# Patient Record
Sex: Male | Born: 1980 | Race: White | Hispanic: No | Marital: Married | Smoking: Former smoker
Health system: Southern US, Community
[De-identification: ages and names within clinical notes are randomized; demographics above are authoritative.]

## PROBLEM LIST (undated history)

## (undated) DIAGNOSIS — T8859XA Other complications of anesthesia, initial encounter: Secondary | ICD-10-CM

## (undated) DIAGNOSIS — F988 Other specified behavioral and emotional disorders with onset usually occurring in childhood and adolescence: Secondary | ICD-10-CM

## (undated) DIAGNOSIS — F419 Anxiety disorder, unspecified: Secondary | ICD-10-CM

## (undated) DIAGNOSIS — R519 Headache, unspecified: Secondary | ICD-10-CM

## (undated) DIAGNOSIS — G4700 Insomnia, unspecified: Secondary | ICD-10-CM

## (undated) HISTORY — PX: APPENDECTOMY: SHX54

## (undated) HISTORY — DX: Insomnia, unspecified: G47.00

## (undated) HISTORY — DX: Anxiety disorder, unspecified: F41.9

## (undated) HISTORY — PX: CHOLECYSTECTOMY: SHX55

## (undated) HISTORY — PX: FOREIGN BODY REMOVAL ABDOMINAL: SHX5319

## (undated) HISTORY — DX: Other specified behavioral and emotional disorders with onset usually occurring in childhood and adolescence: F98.8

---

## 2008-04-04 ENCOUNTER — Emergency Department (HOSPITAL_COMMUNITY): Admission: EM | Admit: 2008-04-04 | Discharge: 2008-04-04 | Payer: Self-pay | Admitting: *Deleted

## 2009-02-22 ENCOUNTER — Encounter: Payer: Self-pay | Admitting: Family Medicine

## 2009-02-22 LAB — CONVERTED CEMR LAB
ALT: 22 units/L
AST: 23 units/L
Cholesterol: 220 mg/dL
Creatinine, Ser: 1 mg/dL
Direct LDL: 179 mg/dL
Glucose, Bld: 90 mg/dL

## 2010-08-05 IMAGING — CT CT HEAD W/O CM
1 series · 16 of 30 positions shown, 20 images · non-contrast
Comparison: None

CLINICAL DATA: Headache, sinus pain

CT HEAD WITHOUT CONTRAST
TECHNIQUE: Contiguous axial images were obtained from the base of
the skull through the vertex without contrast.

[Series 2: head_seq 4.5 h37s st · axial · 0.46mm/px · z∈[+1230,+1374]mm · 16 of 36 slices shown, 20 images]
[im 2/36  brain]
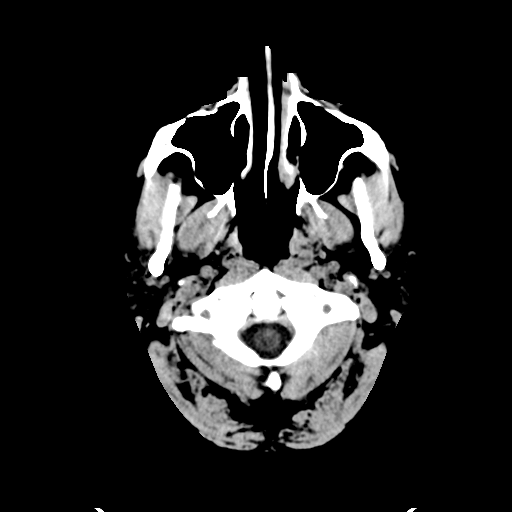
[im 2/36  bone]
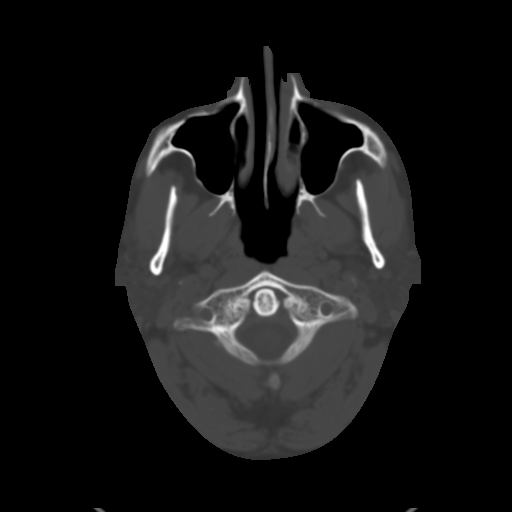
[im 4/36  brain]
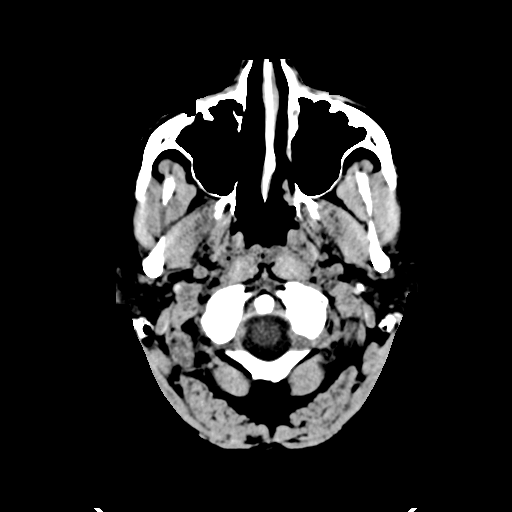
[im 7/36  brain]
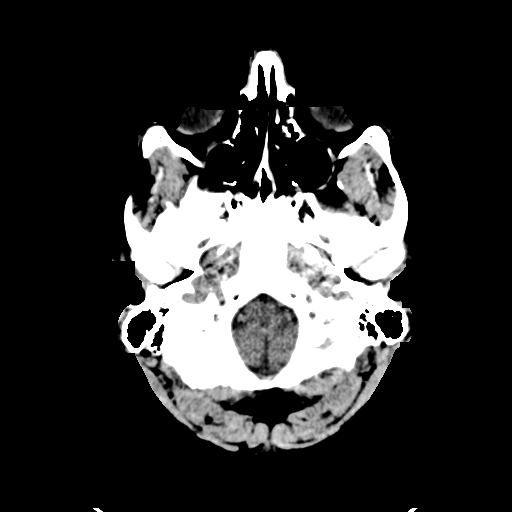
[im 9/36  brain]
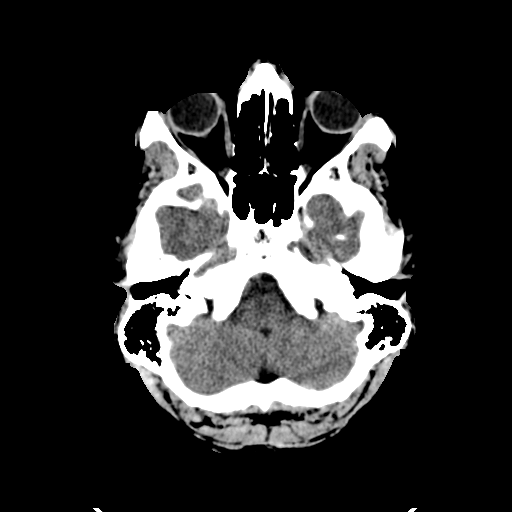
[im 10/36  brain]
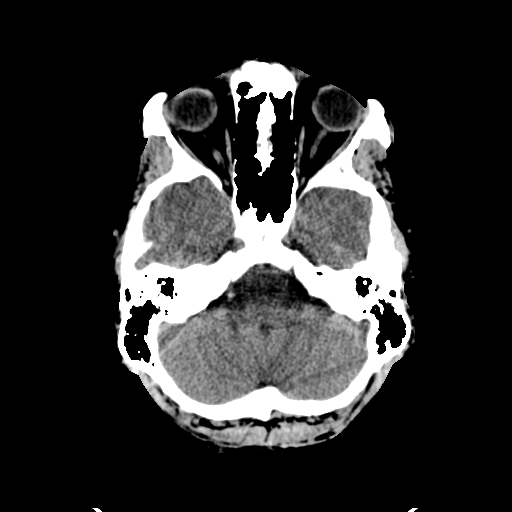
[im 10/36  bone]
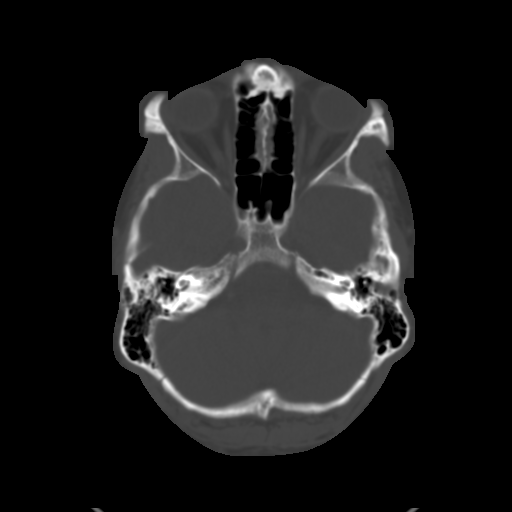
[im 13/36  brain]
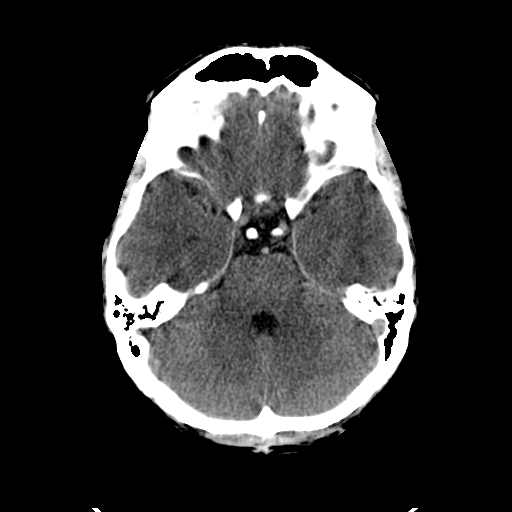
[im 15/36  brain]
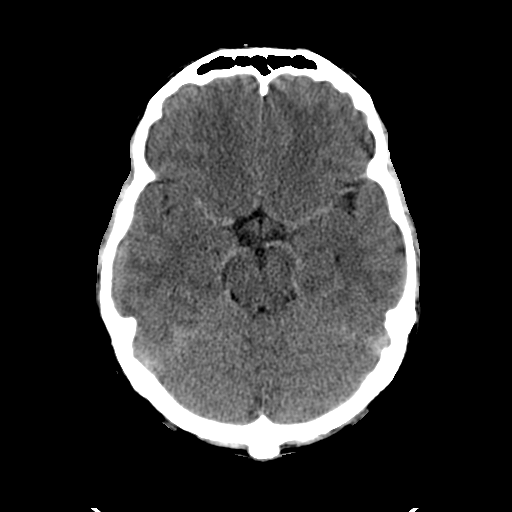
[im 17/36  brain]
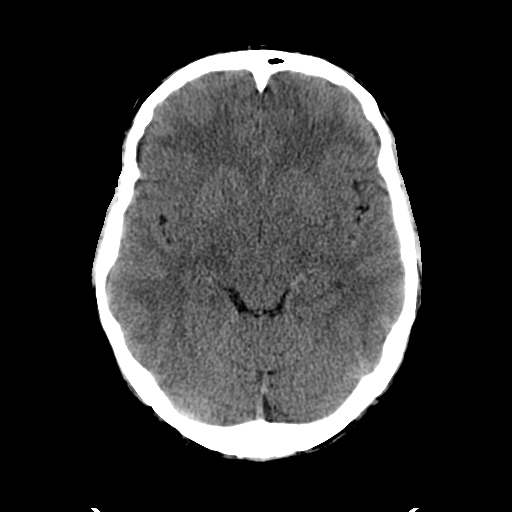
[im 19/36  brain]
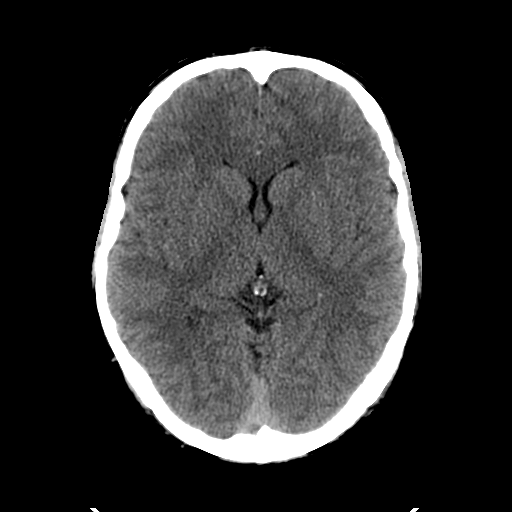
[im 19/36  bone]
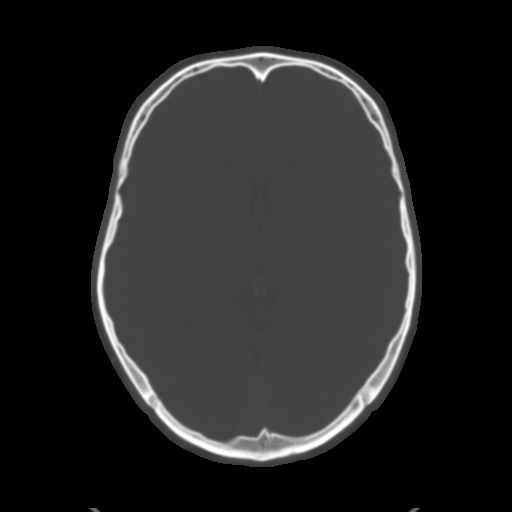
[im 21/36  brain]
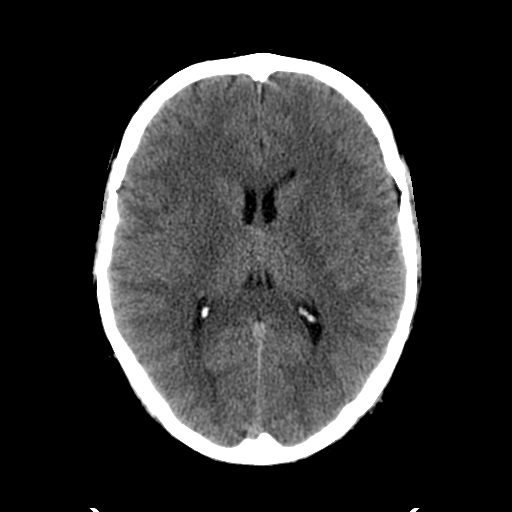
[im 23/36  brain]
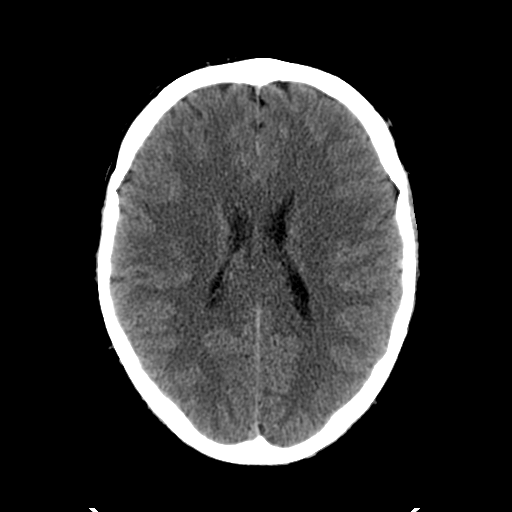
[im 26/36  brain]
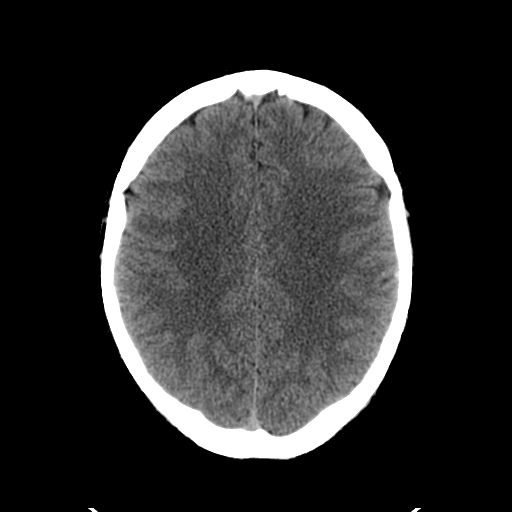
[im 27/36  brain]
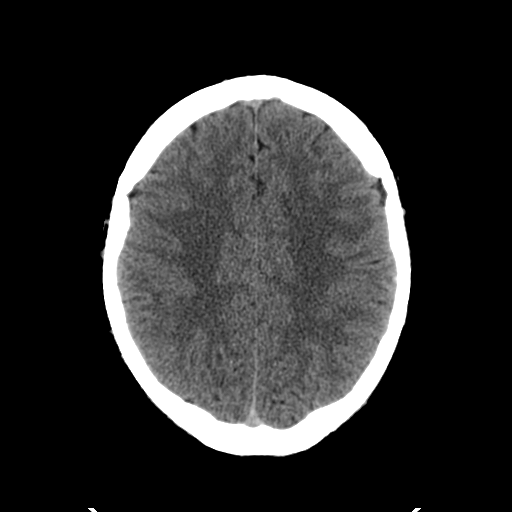
[im 27/36  bone]
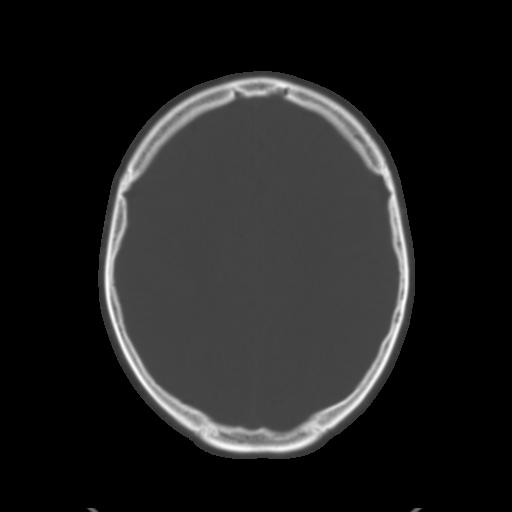
[im 29/36  brain]
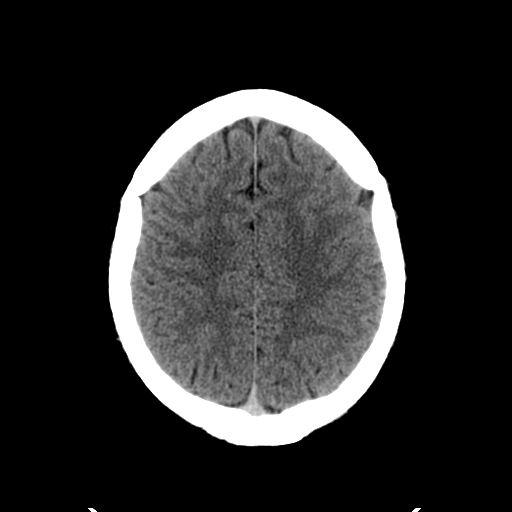
[im 32/36  brain]
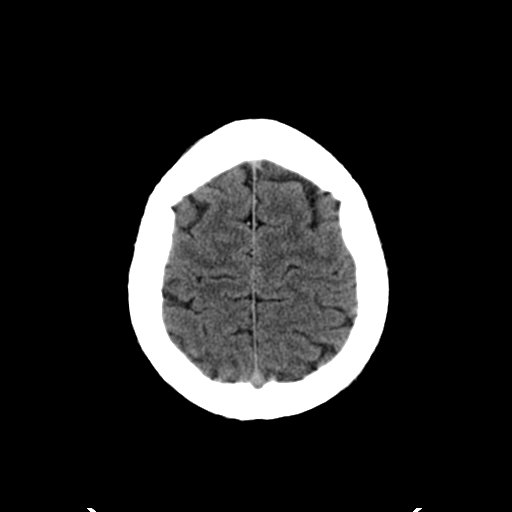
[im 34/36  brain]
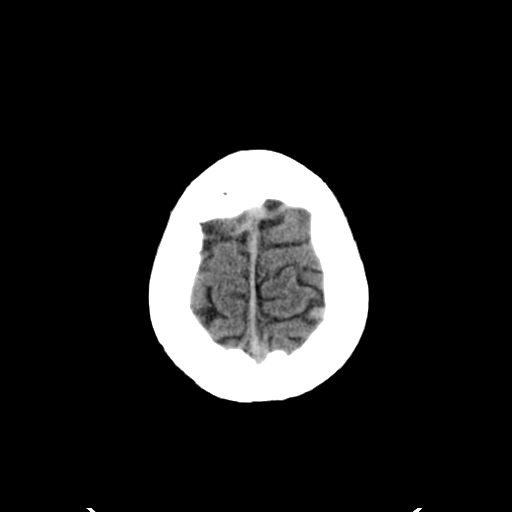

[16 of 30 positions shown; findings below may reference images not displayed]

FINDINGS: Normal ventricular morphology.
No midline shift or mass effect.
Normal appearance of brain parenchyma.
No intracranial hemorrhage, mass lesion, or acute infarct.
Visualized paranasal sinuses and mastoid air cells clear.
Bones unremarkable.
IMPRESSION: No acute intracranial abnormalities.
If patient has persistent or unexplained headache, recommend follow-
up MR imaging of the brain to further assess.

## 2010-09-01 ENCOUNTER — Ambulatory Visit (INDEPENDENT_AMBULATORY_CARE_PROVIDER_SITE_OTHER): Payer: BC Managed Care – PPO | Admitting: Family Medicine

## 2010-09-01 ENCOUNTER — Encounter: Payer: Self-pay | Admitting: Family Medicine

## 2010-09-01 DIAGNOSIS — F988 Other specified behavioral and emotional disorders with onset usually occurring in childhood and adolescence: Secondary | ICD-10-CM

## 2010-09-02 ENCOUNTER — Telehealth (INDEPENDENT_AMBULATORY_CARE_PROVIDER_SITE_OTHER): Payer: Self-pay | Admitting: *Deleted

## 2010-09-03 ENCOUNTER — Encounter: Payer: Self-pay | Admitting: Family Medicine
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
## 2010-09-09 NOTE — Progress Notes (Signed)
----   Converted from flag ---- ---- 09/01/2010 8:56 AM, Delilah Shan CMA (AAMA) wrote: Phoned [facility] and advised as below.  ---- 09/01/2010 8:56 AM, Crawford Givens MD wrote: please call over to [facility] and notify them that patient is getting concerta via our clinic but that he'll keep his next appointment there in 6 months.   thanks. ------------------------------

## 2010-09-10 ENCOUNTER — Encounter: Payer: Self-pay | Admitting: Family Medicine

## 2010-09-18 NOTE — Miscellaneous (Signed)
  Clinical Lists Changes  Observations: Added new observation of MENINGOC VAX: Menactra (02/22/2009 15:14) Added new observation of TD BOOSTER: Tdap (02/22/2009 15:14) Added new observation of MMR #1: MMR (02/22/2009 15:14) Added new observation of HEPBVAX#2: HepB Adult (02/22/2009 15:14) Added new observation of SGPT (ALT): 22 units/L (02/22/2009 15:11) Added new observation of SGOT (AST): 23 units/L (02/22/2009 15:11) Added new observation of CREATININE: 1.00 mg/dL (11/91/4782 95:62) Added new observation of BG RANDOM: 90 mg/dL (13/02/6577 46:96) Added new observation of LDL DIR: 179 mg/dL (29/52/8413 24:40) Added new observation of HDL: 40 mg/dL (05/09/2535 64:40) Added new observation of TRIGLYC TOT: 121 mg/dL (34/74/2595 63:87) Added new observation of CHOLESTEROL: 220 mg/dL (56/43/3295 18:84)      Immunization History:  Hepatitis B Immunization History:    Hepatitis B # 2:  hepb adult (02/22/2009)  MMR Immunization History:    MMR # 1:  mmr (02/22/2009)  Tetanus/Td Immunization History:    Tetanus/Td:  tdap (02/22/2009)  Meningococcal Immunization History:    Meningococcal:  menactra (02/22/2009)  Hepatitis B Vaccine # 1 (to be given today)

## 2010-09-22 ENCOUNTER — Encounter: Payer: Self-pay | Admitting: Family Medicine

## 2010-09-23 NOTE — Letter (Signed)
Summary: [facility]   Imported By: Kassie Mends 09/18/2010 11:31:08  _____________________________________________________________________  External Attachment:    Type:   Image     Comment:   External Document

## 2010-10-03 ENCOUNTER — Ambulatory Visit (INDEPENDENT_AMBULATORY_CARE_PROVIDER_SITE_OTHER): Payer: BC Managed Care – PPO | Admitting: Family Medicine

## 2010-10-03 VITALS — BP 102/78 | HR 100 | Temp 98.2°F | Ht 70.0 in | Wt 192.1 lb

## 2010-10-03 DIAGNOSIS — G4700 Insomnia, unspecified: Secondary | ICD-10-CM

## 2010-10-03 DIAGNOSIS — J029 Acute pharyngitis, unspecified: Secondary | ICD-10-CM

## 2010-10-03 DIAGNOSIS — J309 Allergic rhinitis, unspecified: Secondary | ICD-10-CM

## 2010-10-03 NOTE — Assessment & Plan Note (Addendum)
Start taking 10mg  of claritin/loratidine in the morning, use the nasal saline twice a day, and use the nasonex if not better.  Call with questions.   RST neg, fu prn.  Nontoxic.

## 2010-10-03 NOTE — Progress Notes (Signed)
Felt poorly yesterday, had some sx a few days before that.  Some post nasal, mild elevation in temp yesterday but no fevers.  Fatigued.  Minimal cough, some mucus production.  No ear pain.  Scratchy throat.  Had a strep exposure.  Normally with spring/fall allergies.  Has some nasonex at home.  Using nasal saline.    Meds, vitals, and allergies reviewed.   ROS: See HPI.  Otherwise, noncontributory.  GEN: nad, alert and oriented HEENT: mucous membranes moist, tm w/o erythema, nasal exam w/o erythema, clear discharge noted,  OP with cobblestoning NECK: supple w/o LA CV: rrr.   PULM: ctab, no inc wob EXT: no edema SKIN: no acute rash

## 2010-10-03 NOTE — Patient Instructions (Signed)
Start taking 10mg  of claritin/loratidine in the morning, use the nasal saline twice a day, and use the nasonex if not better.  Call with questions.  Glad to see you today.

## 2010-10-05 ENCOUNTER — Encounter: Payer: Self-pay | Admitting: Family Medicine

## 2010-11-18 ENCOUNTER — Ambulatory Visit (INDEPENDENT_AMBULATORY_CARE_PROVIDER_SITE_OTHER): Payer: BC Managed Care – PPO | Admitting: Family Medicine

## 2010-11-18 ENCOUNTER — Encounter: Payer: Self-pay | Admitting: Family Medicine

## 2010-11-18 VITALS — BP 102/72 | HR 84 | Temp 98.2°F | Wt 199.1 lb

## 2010-11-18 DIAGNOSIS — F988 Other specified behavioral and emotional disorders with onset usually occurring in childhood and adolescence: Secondary | ICD-10-CM

## 2010-11-18 MED ORDER — METHYLPHENIDATE HCL ER (OSM) 36 MG PO TBCR: EXTENDED_RELEASE_TABLET | ORAL | Status: DC

## 2010-11-18 NOTE — Patient Instructions (Signed)
Schedule a follow up appointment in: 6 months.  OV.   Call back sooner if needed. Call back in 3 months for another set of rxs.   Take care.  Glad to see you today.

## 2010-11-18 NOTE — Assessment & Plan Note (Addendum)
Continue current meds.  Tolerated with good effect.  3 rxs done.  He'll call back in 3 months for another 3 months supple.  Fu in 6 months, sooner prn.  It is likely that he has some vagal effect, potentially with compensatory tachycardia afterward.  It only happens with BMs, happens rarely, and he has no red flag sx.  He'll let me know if he has inc in sx or sig change.  >25 min spent with face to face with patient counseling.

## 2010-11-18 NOTE — Progress Notes (Signed)
ADD Compliant with meds:yes benefit from med (ie increase in concentration):yes change in mood:no, mood stable change in appetite:no sig change, some dec during the day but normal at night Insomnia:at baseline tremor:no compliant with behavioral modification:yes Sober.  Sig family stress.  Girlfriend's relative recently in hospital.  They are trying to work through this.   Occ palpitations while on toilet with BM. Only happens during BM.  Happens a few times a year, max.  Self resolves after a few minutes.  No syncope.  No CP.  No exertional sx.  He may go over a year w/o symptoms.    PMH and SH reviewed  ROS: See HPI.  Otherwise noncontributory.   Meds, vitals, and allergies reviewed.   GEN: nad, alert and oriented, affect wnl and appropriate HEENT: mucous membranes moist NECK: supple w/o LA CV: rrr.  PULM: ctab, no inc wob ABD: soft, +bs EXT: no edema CN 2-12 wnl, s/s/dtr wnl x4.  No tremor.

## 2011-01-02 ENCOUNTER — Ambulatory Visit (INDEPENDENT_AMBULATORY_CARE_PROVIDER_SITE_OTHER): Payer: BC Managed Care – PPO | Admitting: Family Medicine

## 2011-01-02 ENCOUNTER — Encounter: Payer: Self-pay | Admitting: Family Medicine

## 2011-01-02 VITALS — BP 120/84 | HR 80 | Temp 99.4°F | Wt 205.0 lb

## 2011-01-02 DIAGNOSIS — J329 Chronic sinusitis, unspecified: Secondary | ICD-10-CM

## 2011-01-02 MED ORDER — FLUTICASONE PROPIONATE 50 MCG/ACT NA SUSP: 2.0000 | Freq: Every day | NASAL | Status: DC

## 2011-01-02 MED ORDER — AZITHROMYCIN 250 MG PO TABS: ORAL_TABLET | ORAL | Status: AC

## 2011-01-02 NOTE — Assessment & Plan Note (Addendum)
Nasal saline and flonase in meantime, and start zmax if not improved.  Okay for outpatient f/u.  I talked with him about the ear findings.  The erythema is mild and the TM isn't bulging.  This may still be a viral process and he isn't having ear pain now. He agrees.

## 2011-01-02 NOTE — Patient Instructions (Signed)
I would use the flonase and nasal saline and then the zithromax if not improved.  Take care.  Glad to see you.

## 2011-01-02 NOTE — Progress Notes (Signed)
duration of symptoms: Sx started ~1 week ago.   Rhinorrhea: a little on Tuesday, mainly congestion since then Congestion: and nasal pressure ear pain:no sore throat:yes Cough:yes, no wheeze Myalgias: mild other concerns: voice change noted.  + fever last night.  Sweats at night. Yellow nasal discharge.   He had laid off on the nasal saline before this illness. He's working on weight loss.  In weight watchers.    ROS: See HPI.  Otherwise negative.    Meds, vitals, and allergies reviewed.   GEN: nad, alert and oriented HEENT: mucous membranes moist, L TM w/o erythema, R TM with mild erythema and effusion noted, nasal epithelium injected, OP with cobblestoning NECK: supple w/o LA CV: rrr. PULM: ctab, no inc wob ABD: soft, +bs EXT: no edema.

## 2011-05-21 ENCOUNTER — Ambulatory Visit (INDEPENDENT_AMBULATORY_CARE_PROVIDER_SITE_OTHER): Payer: BC Managed Care – PPO | Admitting: Family Medicine

## 2011-05-21 DIAGNOSIS — F988 Other specified behavioral and emotional disorders with onset usually occurring in childhood and adolescence: Secondary | ICD-10-CM

## 2011-05-21 MED ORDER — METHYLPHENIDATE HCL ER (OSM) 36 MG PO TBCR: EXTENDED_RELEASE_TABLET | ORAL | Status: DC

## 2011-05-22 ENCOUNTER — Ambulatory Visit: Payer: BC Managed Care – PPO | Admitting: Family Medicine

## 2011-05-22 NOTE — Assessment & Plan Note (Signed)
Appointment cancelled

## 2011-05-22 NOTE — Progress Notes (Signed)
Subjective:    Patient ID: Jorge Francis, male    DOB: 31-Oct-1980, 30 y.o.   MRN: [id number]  HPI    Review of Systems     Objective:   Physical Exam        Assessment & Plan:  Appointment rescheduled.

## 2011-06-16 ENCOUNTER — Ambulatory Visit: Payer: BC Managed Care – PPO | Admitting: Family Medicine

## 2013-02-10 ENCOUNTER — Other Ambulatory Visit: Payer: Self-pay | Admitting: Family Medicine

## 2013-02-10 ENCOUNTER — Other Ambulatory Visit (INDEPENDENT_AMBULATORY_CARE_PROVIDER_SITE_OTHER): Payer: BC Managed Care – PPO

## 2013-02-10 DIAGNOSIS — Z131 Encounter for screening for diabetes mellitus: Secondary | ICD-10-CM

## 2013-02-10 DIAGNOSIS — E785 Hyperlipidemia, unspecified: Secondary | ICD-10-CM

## 2013-02-10 LAB — LDL CHOLESTEROL, DIRECT: Direct LDL: 116.7 mg/dL

## 2013-02-10 LAB — LIPID PANEL
Total CHOL/HDL Ratio: 7
Triglycerides: 427 mg/dL — ABNORMAL HIGH (ref 0.0–149.0)

## 2013-02-14 ENCOUNTER — Encounter: Payer: Self-pay | Admitting: Family Medicine

## 2013-02-14 ENCOUNTER — Ambulatory Visit (INDEPENDENT_AMBULATORY_CARE_PROVIDER_SITE_OTHER): Payer: BC Managed Care – PPO | Admitting: Family Medicine

## 2013-02-14 VITALS — BP 112/78 | HR 89 | Temp 98.0°F | Ht 70.5 in | Wt 243.8 lb

## 2013-02-14 DIAGNOSIS — F988 Other specified behavioral and emotional disorders with onset usually occurring in childhood and adolescence: Secondary | ICD-10-CM

## 2013-02-14 DIAGNOSIS — Z23 Encounter for immunization: Secondary | ICD-10-CM

## 2013-02-14 DIAGNOSIS — Z0000 Encounter for general adult medical examination without abnormal findings: Secondary | ICD-10-CM

## 2013-02-14 MED ORDER — CIPROFLOXACIN HCL 500 MG PO TABS: 500.0000 mg | ORAL_TABLET | Freq: Two times a day (BID) | ORAL | Status: DC

## 2013-02-14 NOTE — Progress Notes (Signed)
CPE- See plan.  Routine anticipatory guidance given to patient.  See health maintenance. Tetanus 2010 Flu shot prev done Diet and exercise discussed.   Wife is designated if incapacitated.    Getting married in [place] in 9/14.  This has been a sig source of (good) changes.   Honeymoon planned in [place].   D/w pt about travel concerns.    ADD per psych clinic.   PMH and SH reviewed  Meds, vitals, and allergies reviewed.   ROS: See HPI.  Otherwise negative.    GEN: nad, alert and oriented HEENT: mucous membranes moist NECK: supple w/o LA CV: rrr. PULM: ctab, no inc wob ABD: soft, +bs EXT: no edema SKIN: no acute rash

## 2013-02-14 NOTE — Patient Instructions (Signed)
Take the cipro if needed.   Schedule the follow up HAV shot in 6 months.  I would get a flu shot each fall.   Take care.

## 2013-02-15 ENCOUNTER — Encounter: Payer: Self-pay | Admitting: Family Medicine

## 2013-02-15 DIAGNOSIS — Z0000 Encounter for general adult medical examination without abnormal findings: Secondary | ICD-10-CM | POA: Insufficient documentation

## 2013-02-15 NOTE — Assessment & Plan Note (Signed)
Per psych 

## 2013-02-15 NOTE — Assessment & Plan Note (Signed)
Routine anticipatory guidance given to patient.  See health maintenance. Tetanus 2010 Flu shot prev done Diet and exercise discussed.   Wife is designated if incapacitated.   Prostate/colon cancer screening not indicated.  HAV started today.  cipro given for traveller's diarrhea if needed. Travel precautions given.  I wished him will with his upcoming marriage.

## 2013-08-23 ENCOUNTER — Ambulatory Visit: Payer: BC Managed Care – PPO

## 2014-07-23 ENCOUNTER — Other Ambulatory Visit: Payer: Self-pay | Admitting: Family Medicine

## 2014-07-23 DIAGNOSIS — E781 Pure hyperglyceridemia: Secondary | ICD-10-CM

## 2014-07-24 ENCOUNTER — Other Ambulatory Visit (INDEPENDENT_AMBULATORY_CARE_PROVIDER_SITE_OTHER): Payer: 59

## 2014-07-24 DIAGNOSIS — E781 Pure hyperglyceridemia: Secondary | ICD-10-CM

## 2014-07-24 LAB — LIPID PANEL
Cholesterol: 241 mg/dL — ABNORMAL HIGH (ref 0–200)
HDL: 31.7 mg/dL — ABNORMAL LOW (ref >39.00)
NONHDL: 209.3
Total CHOL/HDL Ratio: 8
Triglycerides: 424 mg/dL — ABNORMAL HIGH (ref 0.0–149.0)
VLDL: 84.8 mg/dL — AB (ref 0.0–40.0)

## 2014-07-24 LAB — BASIC METABOLIC PANEL
BUN: 11 mg/dL (ref 6–23)
CALCIUM: 9.2 mg/dL (ref 8.4–10.5)
CO2: 23 meq/L (ref 19–32)
Chloride: 107 mEq/L (ref 96–112)
Creatinine, Ser: 0.9 mg/dL (ref 0.4–1.5)
GFR: 107.07 mL/min (ref >60.00)
GLUCOSE: 94 mg/dL (ref 70–99)
POTASSIUM: 4.3 meq/L (ref 3.5–5.1)
Sodium: 135 mEq/L (ref 135–145)

## 2014-07-24 LAB — LDL CHOLESTEROL, DIRECT: LDL DIRECT: 130.4 mg/dL

## 2014-07-27 ENCOUNTER — Encounter: Payer: Self-pay | Admitting: Family Medicine

## 2014-07-27 ENCOUNTER — Ambulatory Visit (INDEPENDENT_AMBULATORY_CARE_PROVIDER_SITE_OTHER): Payer: 59 | Admitting: Family Medicine

## 2014-07-27 VITALS — BP 112/90 | HR 94 | Temp 97.6°F | Ht 71.0 in | Wt 257.5 lb

## 2014-07-27 DIAGNOSIS — F988 Other specified behavioral and emotional disorders with onset usually occurring in childhood and adolescence: Secondary | ICD-10-CM

## 2014-07-27 DIAGNOSIS — Z0000 Encounter for general adult medical examination without abnormal findings: Secondary | ICD-10-CM

## 2014-07-27 DIAGNOSIS — Z23 Encounter for immunization: Secondary | ICD-10-CM

## 2014-07-27 DIAGNOSIS — Z7189 Other specified counseling: Secondary | ICD-10-CM

## 2014-07-27 NOTE — Patient Instructions (Addendum)
Check with your insurance to see if they will cover the nutrition consult.  Let me know if you need a referral.  Jorge Francis will call about your referral in the meantime.  [email or web address] and [email or web address] have good information on diet and exercise.  Glad to see you.  Take care.

## 2014-07-27 NOTE — Progress Notes (Signed)
Pre visit review using our clinic review tool, if applicable. No additional management support is needed unless otherwise documented below in the visit note.  CPE- See plan.  Routine anticipatory guidance given to patient.  See health maintenance. Tetanus 2010 Flu shot today.  Shingles and PNA not due.  Colon and prostate cancer screening not due.   Living will d/w pt.  Wife designated if patient incapacitated.   Diet and exercise d/w pt along with labs.   Smoking d/w pt.  Precontemplative.    He needed a referral for ADD, since his insurance changed.   PMH and SH reviewed  Meds, vitals, and allergies reviewed.   ROS: See HPI.  Otherwise negative.    GEN: nad, alert and oriented HEENT: mucous membranes moist NECK: supple w/o LA CV: rrr. PULM: ctab, no inc wob ABD: soft, +bs EXT: no edema SKIN: no acute rash

## 2014-07-29 DIAGNOSIS — Z7189 Other specified counseling: Secondary | ICD-10-CM | POA: Insufficient documentation

## 2014-07-29 NOTE — Assessment & Plan Note (Signed)
Routine anticipatory guidance given to patient.  See health maintenance. Tetanus 2010 Flu shot today.  Shingles and PNA not due.  Colon and prostate cancer screening not due.   Living will d/w pt.  Wife designated if patient incapacitated.   Diet and exercise d/w pt along with labs.   Smoking d/w pt.  Precontemplative.

## 2014-07-29 NOTE — Assessment & Plan Note (Signed)
Referral back to prev clinic done.

## 2014-07-30 ENCOUNTER — Telehealth: Payer: Self-pay | Admitting: Family Medicine

## 2014-07-30 NOTE — Telephone Encounter (Signed)
emmi emailed °

## 2014-10-03 ENCOUNTER — Ambulatory Visit (INDEPENDENT_AMBULATORY_CARE_PROVIDER_SITE_OTHER): Payer: 59 | Admitting: Family Medicine

## 2014-10-03 ENCOUNTER — Encounter: Payer: Self-pay | Admitting: Family Medicine

## 2014-10-03 VITALS — BP 124/84 | HR 92 | Temp 97.6°F | Wt 251.0 lb

## 2014-10-03 DIAGNOSIS — F909 Attention-deficit hyperactivity disorder, unspecified type: Secondary | ICD-10-CM | POA: Diagnosis not present

## 2014-10-03 DIAGNOSIS — F988 Other specified behavioral and emotional disorders with onset usually occurring in childhood and adolescence: Secondary | ICD-10-CM

## 2014-10-03 NOTE — Progress Notes (Signed)
Pre visit review using our clinic review tool, if applicable. No additional management support is needed unless otherwise documented below in the visit note.  ADD.  He was wanting to get his meds rx'd through here, ie transferring his psych care through here.  Compliant with meds.  No ADE.  He has titrated doses over the years and this current regimen does the most amount of good for the least amount of troubles. "Without my meds, I'm a hot mess."  His concentration is clearly better at work with treatment.   He has enough medicine to go to next month.  Sobriety approaching 10 years.    Meds, vitals, and allergies reviewed.   ROS: See HPI.  Otherwise, noncontributory.  nad ncat Mmm Neck supple, no LA rrr Ctab abd soft No tremor Speech affect and judgement wnl

## 2014-10-03 NOTE — Patient Instructions (Signed)
We'll work on getting your records and then we'll go from there.  We should be able to rx your meds for next month, assuming I can get your records.  If we need a PA, then we'll work on it.  Take care.  Glad to see you.

## 2014-10-04 NOTE — Assessment & Plan Note (Signed)
He is on a high dose of stimulant but w/o ADE and he has done well as is.  He has tried to titrate lower and had more troubles at work with concentration.  I am okay with this current rx regimen.  We'll get records from psych and then I'll plan on rx'ing his next set of meds.  He agrees with plan.

## 2014-10-11 ENCOUNTER — Telehealth: Payer: Self-pay

## 2014-10-11 NOTE — Telephone Encounter (Signed)
Noted, thanks.  I would have sent the rx in, but it was apparently already done.

## 2014-10-11 NOTE — Telephone Encounter (Signed)
Pt left v/m; pt's wife dx today with strep throat; pt has sorethroat,fever ?, chills and pt has appt with Dr Para Marchuncan on 10/12/14 at 3PM. Pt has missed work yesterday and today and request abx (amoxicillin).  I called pt to find out what pharmacy and he said he has spoken with another doctor who is calling in abx and cancel appt for 10/12/14. Advised done. Sent to Dr Para Marchuncan as Lorain ChildesFYI.

## 2014-10-12 ENCOUNTER — Ambulatory Visit: Payer: 59 | Admitting: Family Medicine

## 2014-10-25 ENCOUNTER — Other Ambulatory Visit: Payer: Self-pay

## 2014-10-25 MED ORDER — METHYLPHENIDATE HCL 20 MG PO TABS: 20.0000 mg | ORAL_TABLET | Freq: Every evening | ORAL | Status: DC | PRN

## 2014-10-25 MED ORDER — METHYLPHENIDATE HCL ER (OSM) 54 MG PO TBCR: EXTENDED_RELEASE_TABLET | ORAL | Status: DC

## 2014-10-25 NOTE — Telephone Encounter (Signed)
Pt left v/m requesting rx for concerta 54 mg and ritalin 20 mg. Call when ready for pick up. Pt thinks Dr Para Marchuncan should have received medical records.

## 2014-10-25 NOTE — Telephone Encounter (Signed)
Patient advised.  Rx left at front desk for pick up. 

## 2014-10-25 NOTE — Telephone Encounter (Signed)
Printed.  Thanks.  

## 2014-10-30 ENCOUNTER — Telehealth: Payer: Self-pay

## 2014-10-30 NOTE — Telephone Encounter (Signed)
Does this patient have reason to get the name-brand? Because if he doesn't, the insurance is not going to pay.

## 2014-10-30 NOTE — Telephone Encounter (Signed)
Pt left v/m; prior auth was sent for methylphenidate 54 mg. Pt wants prior auth done for name brand Concerta 54 mg taking 2 tabs by mouth in AM. Pt request cb.

## 2014-10-31 NOTE — Telephone Encounter (Signed)
I had a question about this- please check with patient to clarify.   I'm fine doing the rx and the PA- that isn't the issue.  I know that he needs a high dose of med and that would lead to the PA need.  My question is about the name brand issue- please clarify that with patient and let me know.   Did he try and fail generic med with equivalent dosing? Thanks.

## 2014-10-31 NOTE — Telephone Encounter (Signed)
The PA has been submitted and we're waiting to hear back from them. Thanks.  Please notify pt.

## 2014-10-31 NOTE — Telephone Encounter (Signed)
Pt left voicemail with triage, pt is calling to check status of PA because he hasn't received a call back

## 2014-10-31 NOTE — Telephone Encounter (Signed)
Spoke to patient and was advised that he talked with his insurance company this morning and was advised that they will not cover the generic. Patient stated that they required that he take the brand name Concerta 54 mg. Patient stated that there may also be a problem with the quantity.  Patient stated that he is due a refill Thursday.

## 2014-10-31 NOTE — Telephone Encounter (Signed)
Patient notified as instructed by telephone and verbalized understanding. Patient stated that he is going to the pharmacy tomorrow and see if the script can be filled and if not will call the office and see the status of the PA.

## 2014-11-01 NOTE — Telephone Encounter (Signed)
Noted, thanks, please notify pt/pharmacy.

## 2014-11-01 NOTE — Telephone Encounter (Signed)
Approval letter received from PA.  Placed in In Box for initials to scan.

## 2014-11-02 NOTE — Telephone Encounter (Signed)
[place] [address] notified PA for his medication has been approved.

## 2014-11-26 ENCOUNTER — Other Ambulatory Visit: Payer: Self-pay | Admitting: *Deleted

## 2014-11-26 NOTE — Telephone Encounter (Signed)
Patient left a voicemail requesting refills on Concerta and Ritalin. Last refill on both 10/25/14. Patient requested 3 months on each. Last office visit scheduled was cancelled. Call when ready for pickup.

## 2014-11-27 MED ORDER — METHYLPHENIDATE HCL ER (OSM) 54 MG PO TBCR: EXTENDED_RELEASE_TABLET | ORAL | Status: DC

## 2014-11-27 MED ORDER — METHYLPHENIDATE HCL 20 MG PO TABS: 20.0000 mg | ORAL_TABLET | Freq: Every evening | ORAL | Status: DC | PRN

## 2014-11-27 MED ORDER — METHYLPHENIDATE HCL 20 MG PO TABS
20.0000 mg | ORAL_TABLET | Freq: Every evening | ORAL | Status: DC | PRN
Start: 2014-11-27 — End: 2014-11-27

## 2014-11-27 NOTE — Telephone Encounter (Signed)
3 pairs of rxs printed.  Thanks.

## 2014-11-27 NOTE — Telephone Encounter (Signed)
Left detailed message on voicemail. Rx left at front desk for pick up.  

## 2015-01-22 ENCOUNTER — Encounter: Payer: Self-pay | Admitting: Family Medicine

## 2015-01-22 ENCOUNTER — Ambulatory Visit (INDEPENDENT_AMBULATORY_CARE_PROVIDER_SITE_OTHER): Payer: 59 | Admitting: Family Medicine

## 2015-01-22 VITALS — BP 116/80 | HR 94 | Temp 98.5°F | Wt 246.5 lb

## 2015-01-22 DIAGNOSIS — F909 Attention-deficit hyperactivity disorder, unspecified type: Secondary | ICD-10-CM

## 2015-01-22 DIAGNOSIS — G4700 Insomnia, unspecified: Secondary | ICD-10-CM

## 2015-01-22 DIAGNOSIS — F411 Generalized anxiety disorder: Secondary | ICD-10-CM

## 2015-01-22 DIAGNOSIS — L739 Follicular disorder, unspecified: Secondary | ICD-10-CM | POA: Diagnosis not present

## 2015-01-22 DIAGNOSIS — F988 Other specified behavioral and emotional disorders with onset usually occurring in childhood and adolescence: Secondary | ICD-10-CM

## 2015-01-22 MED ORDER — HYDROXYZINE HCL 10 MG PO TABS: 5.0000 mg | ORAL_TABLET | Freq: Three times a day (TID) | ORAL | Status: DC | PRN

## 2015-01-22 MED ORDER — TRAZODONE HCL 100 MG PO TABS: 200.0000 mg | ORAL_TABLET | Freq: Every day | ORAL | Status: DC

## 2015-01-22 MED ORDER — DOXYCYCLINE HYCLATE 100 MG PO TABS: 100.0000 mg | ORAL_TABLET | Freq: Two times a day (BID) | ORAL | Status: DC

## 2015-01-22 NOTE — Progress Notes (Signed)
Pre visit review using our clinic review tool, if applicable. No additional management support is needed unless otherwise documented below in the visit note.  He is 10 days apart on the concerta/ritalin rxs.  He wanted to get his rxs to "line up" with a rx of #50 of the  pills to have them coincide.  This is reasonable.  I can do it with the next set of meds.  No ADE on meds.  Concentration improved with meds as current dosed.   Occ insomnia, not every night.  Had used trazodone prn w/o ADE.  Needed refill.   Anxiety.  Longstanding, had tried buspar but couldn't tolerate.   wasn't effective, higher dose made him drowsy.  Asking about options.  He had heard of individuals using tizanidine prn.  We talked about options in general.    Itchy rash on the neck.  Started about 1 week ago.  Noted on the L side of the face.  Now anterior neck.  Not ill overall.   He has snoring but no apnea hx noted.  D/w pt about diet and exercise, he is working on both.  Not ready to stop smoking.   Meds, vitals, and allergies reviewed.   ROS: See HPI.  Otherwise, noncontributory.  GEN: nad, alert and oriented, speech and affect wnl.   HEENT: mucous membranes moist NECK: supple w/o LA.  Follicular rash noted on the anterior neck and upper chest, some similar lesions noted inferior to L sideburn.  No fluctuant mass.

## 2015-01-22 NOTE — Patient Instructions (Addendum)
Let me know when you need your next concerta/ritalin rx.  Let me know the exact run out dates.  Start taking doxy.  Try to limit shaving.  If itching, use hydrocortisone cream (OTC). Take care.  Glad to see you.

## 2015-01-23 DIAGNOSIS — G4700 Insomnia, unspecified: Secondary | ICD-10-CM | POA: Insufficient documentation

## 2015-01-23 DIAGNOSIS — L739 Follicular disorder, unspecified: Secondary | ICD-10-CM | POA: Insufficient documentation

## 2015-01-23 DIAGNOSIS — F411 Generalized anxiety disorder: Secondary | ICD-10-CM | POA: Insufficient documentation

## 2015-01-23 NOTE — Assessment & Plan Note (Signed)
Continue prn trazodone.  

## 2015-01-23 NOTE — Assessment & Plan Note (Signed)
He is 10 days apart on the concerta/ritalin rxs. He wanted to get his rxs to "line up" with a rx of #50 of the  pills to have them coincide. This is reasonable. I can do it with the next set of meds. No ADE on meds. Concentration improved with meds as current dosed.  He'll remind me about his run out dates on his meds for his next sets.  >25 minutes spent in face to face time with patient, >50% spent in counselling or coordination of care.

## 2015-01-23 NOTE — Assessment & Plan Note (Signed)
Start doxy, can't take PCN related med.  D/w pt.  Should resolve.

## 2015-01-23 NOTE — Assessment & Plan Note (Signed)
Stop buspar, can try atarax prn with sedation caution.

## 2015-02-18 ENCOUNTER — Other Ambulatory Visit: Payer: Self-pay

## 2015-02-18 MED ORDER — METHYLPHENIDATE HCL ER (OSM) 54 MG PO TBCR: 108.0000 mg | EXTENDED_RELEASE_TABLET | ORAL | Status: DC

## 2015-02-18 MED ORDER — METHYLPHENIDATE HCL 20 MG PO TABS
20.0000 mg | ORAL_TABLET | Freq: Every evening | ORAL | Status: DC | PRN
Start: 2015-02-18 — End: 2015-05-20

## 2015-02-18 MED ORDER — METHYLPHENIDATE HCL 20 MG PO TABS: 20.0000 mg | ORAL_TABLET | Freq: Every evening | ORAL | Status: DC | PRN

## 2015-02-18 MED ORDER — METHYLPHENIDATE HCL ER (OSM) 54 MG PO TBCR: EXTENDED_RELEASE_TABLET | ORAL | Status: DC

## 2015-02-18 NOTE — Telephone Encounter (Signed)
Patient already has the quantity issues worked out through the pharmacy.  He now only needs the 3 Rx's for 3 months supplies with Concerta #54 and Ritalin.

## 2015-02-18 NOTE — Telephone Encounter (Signed)
All 3 pairs printed.  Thankd.

## 2015-02-18 NOTE — Telephone Encounter (Signed)
Will you please verify this for me? We talked about it as the last OV.   He was 10 days apart on the concerta/ritalin rxs. He wanted to get his rxs to "line up" with a rx of #50 of the  pills to have them coincide. This is reasonable. I can do it with the next set of meds, assuming this is still the case.  I just don't know if his insurance will cover it.  I may have to write for the 3 pairs with an extra rx of 20 of the  pills.  Let me know.  Thanks.  I didn't print anything yet.

## 2015-02-18 NOTE — Telephone Encounter (Signed)
Pt left v/m requesting rx methylphenidate 20 mg (rx last printed # 30 x 1 on 11/27/14) and 54 mg (rx last printed # 60 x 1 on 11/27/14)the patient last seen on 01/22/15. Since pt cannot get filled until 02/26/15 will send to Dr Lianne Bushy in box for review upon return.pt also request 3 rx for each.Please advise.

## 2015-02-19 ENCOUNTER — Other Ambulatory Visit: Payer: Self-pay | Admitting: Family Medicine

## 2015-02-19 MED ORDER — METHYLPHENIDATE HCL ER (OSM) 54 MG PO TBCR: 108.0000 mg | EXTENDED_RELEASE_TABLET | ORAL | Status: DC

## 2015-02-19 NOTE — Telephone Encounter (Signed)
Patient advised.  Rx left at front desk for pick up. 

## 2015-02-19 NOTE — Progress Notes (Unsigned)
I noted that I printed one of the rx's incorrectly, with #30 instead of 60.  I voided it out along with the attached rx and reprinted both.  Thanks.

## 2015-05-20 ENCOUNTER — Other Ambulatory Visit: Payer: Self-pay

## 2015-05-20 NOTE — Telephone Encounter (Signed)
Pt left v/m requesting 3 refills each of concerta(last printed # 60 x 2 on 02/18/15) and ritalin(last printed # 30 x 2 on 02/18/15.Marland Kitchen. Pt last seen 01/22/15.

## 2015-05-21 MED ORDER — METHYLPHENIDATE HCL 20 MG PO TABS
20.0000 mg | ORAL_TABLET | Freq: Every evening | ORAL | Status: DC | PRN
Start: 2015-05-21 — End: 2015-08-18

## 2015-05-21 MED ORDER — METHYLPHENIDATE HCL ER (OSM) 54 MG PO TBCR: 108.0000 mg | EXTENDED_RELEASE_TABLET | ORAL | Status: DC

## 2015-05-21 MED ORDER — METHYLPHENIDATE HCL ER (OSM) 54 MG PO TBCR: EXTENDED_RELEASE_TABLET | ORAL | Status: DC

## 2015-05-21 MED ORDER — METHYLPHENIDATE HCL 20 MG PO TABS: 20.0000 mg | ORAL_TABLET | Freq: Every evening | ORAL | Status: DC | PRN

## 2015-05-21 NOTE — Telephone Encounter (Signed)
Printed.  Thanks.  

## 2015-05-21 NOTE — Telephone Encounter (Signed)
Patient notified that Rx is ready for pick up.

## 2015-06-11 ENCOUNTER — Ambulatory Visit (INDEPENDENT_AMBULATORY_CARE_PROVIDER_SITE_OTHER): Payer: 59 | Admitting: Family Medicine

## 2015-06-11 ENCOUNTER — Encounter: Payer: Self-pay | Admitting: Family Medicine

## 2015-06-11 VITALS — BP 112/80 | HR 99 | Temp 98.3°F | Wt 234.5 lb

## 2015-06-11 DIAGNOSIS — Z23 Encounter for immunization: Secondary | ICD-10-CM

## 2015-06-11 DIAGNOSIS — F909 Attention-deficit hyperactivity disorder, unspecified type: Secondary | ICD-10-CM

## 2015-06-11 DIAGNOSIS — R55 Syncope and collapse: Secondary | ICD-10-CM

## 2015-06-11 DIAGNOSIS — F988 Other specified behavioral and emotional disorders with onset usually occurring in childhood and adolescence: Secondary | ICD-10-CM

## 2015-06-11 NOTE — Patient Instructions (Signed)
Your EKG looks fine.   Post-micturition syncope is a form of situational vasovagal syncope that accounts for up to 5 percent of cases of syncope. The cause for this type of syncope is probably related to an abrupt change in position combined with a strong vagal stimulus.  Sit down when you urinate at night and update me if you have more episodes.  The muscle strain should get better.   Let me know when you need refills on meds.  Take care.  Glad to see you.

## 2015-06-11 NOTE — Progress Notes (Signed)
Pre visit review using our clinic review tool, if applicable. No additional management support is needed unless otherwise documented below in the visit note.  Neck sore and tender.  Along the B SCM, R>L.  Started a few days ago, see below.    He had gotten up to use the BR at night and then felt like he was going to pass out as he was urinating.  He was able to get to the floor under control, then passed out and woke up on the floor.  No injury.   Second episode was a few days later, similar, at night with urination.  3rd episode was after urinating, couldn't get to floor in time and fell to floor.  No new meds.  No h/o syncope prior to these episodes.  He could feel prodrome with the recent episodes.  No CP, SOB, BLE.  Did have some nausea with the events.    He has some incidental longstanding numbness in the L 1st toe, mentioned for documentation only, not progressive or debilitating.  Not related to shoes.  No trauma.  No other paresthesia.   Will need to change to generic short acting stimulant in 2017 due to cost, d/w pt.  We can address in early 2017 and he'll call back about that.   Meds, vitals, and allergies reviewed.   ROS: See HPI.  Otherwise, noncontributory.  GEN: nad, alert and oriented HEENT: mucous membranes moist NECK: supple w/o LA, R>L SCM ttp, no rash, no bruising.   CV: rrr.  no murmur PULM: ctab, no inc wob ABD: soft, +bs EXT: no edema SKIN: no acute rash  EKG wnl. D/w pt at OV.

## 2015-06-12 DIAGNOSIS — R55 Syncope and collapse: Secondary | ICD-10-CM | POA: Insufficient documentation

## 2015-06-12 NOTE — Assessment & Plan Note (Signed)
Will need to change to generic short acting stimulant in 2017 due to cost, d/w pt. We can address in early 2017 and he'll call back about that.  Will likely need generic methylphenidate 20mg  tabs, possibly taking 20-40mg  TID.  We can consider in 2017.

## 2015-06-12 NOTE — Assessment & Plan Note (Signed)
Sx only with post voiding, normal exam o/w today and no apparent injury other then SCM strain that should resolve.  dw pt.  He can sit when voiding at night.  If still with sx at night or other episodes, then we can refer but this appears to be a benign process.   D/w pt.  He agrees.  Still okay for outpatient f/u.  >25 minutes spent in face to face time with patient, >50% spent in counselling or coordination of care.

## 2015-08-16 ENCOUNTER — Telehealth: Payer: Self-pay | Admitting: *Deleted

## 2015-08-16 NOTE — Telephone Encounter (Signed)
Patient is requesting refills on methylphenidate 20 mg.  He is changing to [employer] - he is uninsured and is able to get this at as cheaper price.  He is asking for 20 mg take 4 daily instead of the 54 mg.  He says he has discussed this with you in the past.  Both were last filled 05/21/15 x 3 refills.  Please advise.

## 2015-08-18 MED ORDER — METHYLPHENIDATE HCL 20 MG PO TABS: ORAL_TABLET | ORAL | Status: DC

## 2015-08-18 NOTE — Telephone Encounter (Signed)
This is reasonable (and still a lower total milligram dose compared to prev).  Printed.  Thanks.

## 2015-08-19 NOTE — Telephone Encounter (Signed)
Patient advised.  Rx left at front desk for pick up. 

## 2015-09-13 ENCOUNTER — Ambulatory Visit (INDEPENDENT_AMBULATORY_CARE_PROVIDER_SITE_OTHER): Payer: Self-pay | Admitting: Urgent Care

## 2015-09-13 VITALS — BP 148/88 | HR 98 | Temp 98.4°F | Resp 16 | Ht 70.5 in | Wt 244.4 lb

## 2015-09-13 DIAGNOSIS — R52 Pain, unspecified: Secondary | ICD-10-CM

## 2015-09-13 DIAGNOSIS — R030 Elevated blood-pressure reading, without diagnosis of hypertension: Secondary | ICD-10-CM

## 2015-09-13 DIAGNOSIS — R195 Other fecal abnormalities: Secondary | ICD-10-CM

## 2015-09-13 DIAGNOSIS — K529 Noninfective gastroenteritis and colitis, unspecified: Secondary | ICD-10-CM

## 2015-09-13 DIAGNOSIS — R631 Polydipsia: Secondary | ICD-10-CM

## 2015-09-13 DIAGNOSIS — R1030 Lower abdominal pain, unspecified: Secondary | ICD-10-CM

## 2015-09-13 LAB — POCT URINALYSIS DIP (MANUAL ENTRY)
Bilirubin, UA: NEGATIVE
Glucose, UA: NEGATIVE
Ketones, POC UA: NEGATIVE
LEUKOCYTES UA: NEGATIVE
Nitrite, UA: NEGATIVE
SPEC GRAV UA: 1.025
UROBILINOGEN UA: 0.2
pH, UA: 6

## 2015-09-13 LAB — POCT GLYCOSYLATED HEMOGLOBIN (HGB A1C): HEMOGLOBIN A1C: 5

## 2015-09-13 LAB — POCT INFLUENZA A/B
Influenza A, POC: NEGATIVE
Influenza B, POC: NEGATIVE

## 2015-09-13 NOTE — Patient Instructions (Signed)

## 2015-09-13 NOTE — Progress Notes (Signed)
MRN: [id number] DOB: 28-Nov-1980  Subjective:   Jorge Francis is a 35 y.o. male presenting for chief complaint of Abdominal Pain; Nausea; Diarrhea; Hot Flashes; Generalized Body Aches; and Fatigue  Reports ~4 day history of loose stools, having 4-5 BM per day, abdominal "discomfort", nausea and vomiting x1, body aches, dizziness, irritable. Has tried Tums which helped with burping and sour brash. Has also tried Flonase for congestion. Denies bloody stools, cough, headache, chest pain, shob, dysuria, hematuria. Of note, patient eats fast food regularly. Drinks ~6 Diet [employer] daily. Smokes 1.5ppd, is not interested in quitting at all.  Jorge Francis has a current medication list which includes the following prescription(s): fluticasone, methylphenidate, trazodone, methylphenidate, and methylphenidate. Also is allergic to penicillins and buspar.  Jorge Francis  has a past medical history of Anxiety; Insomnia; and ADD (attention deficit disorder). Also  has past surgical history that includes Foreign body removal abdominal; Appendectomy; and Cholecystectomy.  His family history includes Diabetes in his maternal grandmother and paternal uncle; Heart disease in his maternal grandmother, mother, and paternal uncle. There is no history of Colon cancer or Prostate cancer.  Objective:   Vitals: BP 148/88 mmHg  Pulse 98  Temp(Src) 98.4 F (36.9 C) (Oral)  Resp 16  Ht 5' 10.5" (1.791 m)  Wt 244 lb 6.4 oz (110.859 kg)  BMI 34.56 kg/m2  SpO2 96%  Physical Exam  Constitutional: He is oriented to person, place, and time. He appears well-developed and well-nourished.  HENT:  Mouth/Throat: Oropharynx is clear and moist.  Eyes: Pupils are equal, round, and reactive to light. No scleral icterus.  Neck: Normal range of motion. Neck supple.  Cardiovascular: Normal rate, regular rhythm and intact distal pulses.  Exam reveals no gallop and no friction rub.   No murmur heard. Pulmonary/Chest: No respiratory  distress. He has no wheezes. He has no rales.  Abdominal: Soft. Bowel sounds are normal. He exhibits no distension and no mass. There is tenderness (lower abdomen).  Large vertical surgical scar.  Neurological: He is alert and oriented to person, place, and time.  Skin: Skin is warm and dry.   Results for orders placed or performed in visit on 09/13/15 (from the past 24 hour(s))  POCT Influenza A/B     Status: None   Collection Time: 09/13/15  3:33 PM  Result Value Ref Range   Influenza A, POC Negative Negative   Influenza B, POC Negative Negative  POCT urinalysis dipstick     Status: Abnormal   Collection Time: 09/13/15  3:33 PM  Result Value Ref Range   Color, UA yellow yellow   Clarity, UA clear clear   Glucose, UA negative negative   Bilirubin, UA negative negative   Ketones, POC UA negative negative   Spec Grav, UA 1.025    Blood, UA small (A) negative   pH, UA 6.0    Protein Ur, POC =30 (A) negative   Urobilinogen, UA 0.2    Nitrite, UA Negative Negative   Leukocytes, UA Negative Negative  POCT glycosylated hemoglobin (Hb A1C)     Status: None   Collection Time: 09/13/15  3:34 PM  Result Value Ref Range   Hemoglobin A1C 5.0    Assessment and Plan :   1. Gastroenteritis 2. Lower abdominal pain 3. Loose stools 4. Body aches - Likely undergoing viral gastroenteritis worsened by his food choices. Counseled on conservative management, dietary modifications. Patient verbalized understanding. Requested his notes be sent to his PCP, Dr. Para March.  5. Elevated  blood pressure reading without diagnosis of hypertension - Denies personal history of HTN. I recommended patient recheck this in 1 month and set up an office visit with his PCP if it remains >140 systolic.  [name], PA-C [facility] [phone] 09/13/2015 2:20 PM

## 2015-09-23 ENCOUNTER — Ambulatory Visit (INDEPENDENT_AMBULATORY_CARE_PROVIDER_SITE_OTHER): Payer: Self-pay | Admitting: Family Medicine

## 2015-09-23 ENCOUNTER — Encounter: Payer: Self-pay | Admitting: Family Medicine

## 2015-09-23 VITALS — BP 128/100 | HR 104 | Temp 98.1°F | Wt 245.2 lb

## 2015-09-23 DIAGNOSIS — L089 Local infection of the skin and subcutaneous tissue, unspecified: Secondary | ICD-10-CM

## 2015-09-23 DIAGNOSIS — IMO0002 Reserved for concepts with insufficient information to code with codable children: Secondary | ICD-10-CM

## 2015-09-23 DIAGNOSIS — R300 Dysuria: Secondary | ICD-10-CM

## 2015-09-23 DIAGNOSIS — J069 Acute upper respiratory infection, unspecified: Secondary | ICD-10-CM

## 2015-09-23 LAB — POC URINALSYSI DIPSTICK (AUTOMATED)
Bilirubin, UA: NEGATIVE
GLUCOSE UA: NEGATIVE
Ketones, UA: NEGATIVE
Leukocytes, UA: NEGATIVE
Nitrite, UA: NEGATIVE
UROBILINOGEN UA: 4
pH, UA: 6

## 2015-09-23 LAB — POCT RAPID STREP A (OFFICE): Rapid Strep A Screen: NEGATIVE

## 2015-09-23 NOTE — Progress Notes (Signed)
Pre visit review using our clinic review tool, if applicable. No additional management support is needed unless otherwise documented below in the visit note.  He is on short acting ADD meds, with peaks and valleys noted on/off med but able to tolerate.  This rx is cheaper for patient.   Had been ill recently, with diarrhea[facility] BP elevated at that OV.   He is cutting back on caffeine, but still with about 4 per day.  D/w pt.   He prev didn't have elevated BP in last OVs here.  No CP, BLE.   Smoker.  Have d/w pt about smoking cessation.  Precontemplative.    Prev with a small amout of blood in urine on dip at [facility]. No dysuria.   Meds, vitals, and allergies reviewed.   ROS: See HPI.  Otherwise, noncontributory.  GEN: nad, alert and oriented HEENT: mucous membranes moist, tm w/o erythema, nasal exam w/o erythema, clear discharge noted,  OP with cobblestoning and some mild exudate NECK: supple w/o LA CV: rrr.   PULM: ctab, no inc wob EXT: no edema ABD soft, not ttp, normal BS

## 2015-09-23 NOTE — Patient Instructions (Signed)
We'll contact you with your lab report. Strep test was negative.  Likely with changes in the throat related to the ongoing illness, presumed viral.  Treat it like a cold. Rest and fluids.  We'll make plans after I check your urine.  Take care.  Glad to see you.

## 2015-09-24 DIAGNOSIS — J069 Acute upper respiratory infection, unspecified: Secondary | ICD-10-CM | POA: Insufficient documentation

## 2015-09-24 NOTE — Assessment & Plan Note (Signed)
With elevated BP today.  U/a with blood on the dip but u/a micro by MD wnl, no blood seen.  I think the dip was a false positive.   I wouldn't do anything about his BP until after his likely URI sx are resolved.   If still with consistently elevated BP at that point, then he'll let me know.  Supportive care in meantime.

## 2015-11-11 ENCOUNTER — Other Ambulatory Visit: Payer: Self-pay

## 2015-11-11 MED ORDER — METHYLPHENIDATE HCL 20 MG PO TABS: ORAL_TABLET | ORAL | Status: DC

## 2015-11-11 NOTE — Telephone Encounter (Signed)
Pt left v/m requesting rx methylphenidate. (last printed # 120 x 2 on 08/18/15) last seen 09/23/15. Pt request cb when ready for pick up.

## 2015-11-11 NOTE — Telephone Encounter (Signed)
Printed.  Thanks.  

## 2015-11-12 ENCOUNTER — Other Ambulatory Visit: Payer: Self-pay | Admitting: *Deleted

## 2015-11-12 MED ORDER — TRAZODONE HCL 100 MG PO TABS: 200.0000 mg | ORAL_TABLET | Freq: Every day | ORAL | Status: DC

## 2015-11-12 NOTE — Telephone Encounter (Signed)
Patient advised.  Rx left at front desk for pick up. 

## 2015-11-12 NOTE — Telephone Encounter (Signed)
Printed.  Thanks.  

## 2015-11-12 NOTE — Telephone Encounter (Signed)
Patient requests RF to be picked up on Thursday with Methylphenidate Rx's.  Last Filled:    90 tablet 1 01/22/2015  Please advise.

## 2016-01-30 ENCOUNTER — Ambulatory Visit (INDEPENDENT_AMBULATORY_CARE_PROVIDER_SITE_OTHER): Payer: BLUE CROSS/BLUE SHIELD | Admitting: Family Medicine

## 2016-01-30 ENCOUNTER — Encounter: Payer: Self-pay | Admitting: Family Medicine

## 2016-01-30 VITALS — BP 144/104 | HR 96 | Temp 98.5°F | Wt 255.2 lb

## 2016-01-30 DIAGNOSIS — F909 Attention-deficit hyperactivity disorder, unspecified type: Secondary | ICD-10-CM

## 2016-01-30 DIAGNOSIS — K469 Unspecified abdominal hernia without obstruction or gangrene: Secondary | ICD-10-CM

## 2016-01-30 DIAGNOSIS — F988 Other specified behavioral and emotional disorders with onset usually occurring in childhood and adolescence: Secondary | ICD-10-CM

## 2016-01-30 MED ORDER — METHYLPHENIDATE HCL 20 MG PO TABS
20.0000 mg | ORAL_TABLET | Freq: Every evening | ORAL | Status: DC
Start: 2016-01-30 — End: 2016-04-22

## 2016-01-30 MED ORDER — METHYLPHENIDATE HCL 20 MG PO TABS: 20.0000 mg | ORAL_TABLET | Freq: Every evening | ORAL | Status: DC

## 2016-01-30 MED ORDER — METHYLPHENIDATE HCL ER (OSM) 54 MG PO TBCR: 108.0000 mg | EXTENDED_RELEASE_TABLET | ORAL | Status: DC

## 2016-01-30 NOTE — Patient Instructions (Signed)
Jorge Francis will call about your referral. Change back to concerta.  If BP isn't improved and GI symptoms aren't better, then let me know.  Take care.  Glad to see you.

## 2016-01-30 NOTE — Progress Notes (Signed)
Pre visit review using our clinic review tool, if applicable. No additional management support is needed unless otherwise documented below in the visit note.  Wife is 5 months pregnant, her morning sickness is better in the meantime.  She is due in November.  I offered congrats.    Loose stools/frequency.  4 BMs per day.  Not frank diarrhea. No blood in stool.  Greasy stools, but has h/o cholecystomy.  Noted to have the change with change to ritalin prev.  Prev on concerta and didn't have GI sx.  He noted more ups and downs on ritalin, with concentration changes. He felt better on concerta.    BP has been mildly elevated on home checks.    Hernia noted more often, pushing out a little more.    Meds, vitals, and allergies reviewed.   ROS: Per HPI unless specifically indicated in ROS section   GEN: nad, alert and oriented HEENT: mucous membranes moist NECK: supple w/o LA CV: rrr.  no murmur PULM: ctab, no inc wob ABD: soft, +bs, soft midline abd hernia noted, easily reduced.  EXT: no edema SKIN: no acute rash

## 2016-01-31 NOTE — Assessment & Plan Note (Signed)
Loose stools/frequency.  4 BMs per day.  Not frank diarrhea. No blood in stool.  Greasy stools, but has h/o cholecystomy.  Noted to have the change with change to ritalin prev.  Prev on concerta and didn't have GI sx.  He noted more ups and downs on ritalin, with concentration changes. He felt better on concerta. Change back to concerta and he'll update me about BP/concentration and GI sx.  If still with GI sx, can consider questran.  D/w pt.  He agrees.  We'll address BP as needed.  D/w pt about diet and exercise/weight.

## 2016-02-13 ENCOUNTER — Other Ambulatory Visit: Payer: Self-pay | Admitting: General Surgery

## 2016-02-13 DIAGNOSIS — K432 Incisional hernia without obstruction or gangrene: Secondary | ICD-10-CM

## 2016-04-22 ENCOUNTER — Other Ambulatory Visit: Payer: Self-pay

## 2016-04-22 NOTE — Telephone Encounter (Signed)
Pt left v/m requesting refill concerta last printed # 60 x 2 on 01/30/16 and ritalin last printed # 30 on 01/30/16; pt last seen 01/30/16.

## 2016-04-23 MED ORDER — METHYLPHENIDATE HCL 20 MG PO TABS: 20.0000 mg | ORAL_TABLET | Freq: Every evening | ORAL | 0 refills | Status: DC

## 2016-04-23 MED ORDER — METHYLPHENIDATE HCL ER (OSM) 54 MG PO TBCR: 108.0000 mg | EXTENDED_RELEASE_TABLET | ORAL | 0 refills | Status: DC

## 2016-04-23 NOTE — Telephone Encounter (Signed)
Printed.  Thanks.  How are they doing (pt/wife)?  She was ~5 months pregnant the last time I saw him in July.

## 2016-04-23 NOTE — Telephone Encounter (Signed)
Spoke to pt. Rxs up front ready for pickup. He said he is getting stressed out a little. The baby is due mid-November.

## 2016-05-06 ENCOUNTER — Telehealth: Payer: Self-pay

## 2016-05-06 NOTE — Telephone Encounter (Signed)
Pt's wife is going to have a baby in 3 weeks and wants to know if upto date on tdap; immunization list has tdap given on 02/22/2009. Since it has been 7 years should pt get another tdap.Please advise.

## 2016-05-07 ENCOUNTER — Ambulatory Visit (INDEPENDENT_AMBULATORY_CARE_PROVIDER_SITE_OTHER): Payer: BLUE CROSS/BLUE SHIELD

## 2016-05-07 DIAGNOSIS — Z23 Encounter for immunization: Secondary | ICD-10-CM

## 2016-05-07 NOTE — Telephone Encounter (Signed)
Patient advised.

## 2016-05-07 NOTE — Telephone Encounter (Signed)
He should be okay with prev Tdap done. Thanks.  Would need routine tetanus update in 3 more years.

## 2016-05-25 ENCOUNTER — Telehealth: Payer: Self-pay

## 2016-05-25 NOTE — Telephone Encounter (Signed)
Pt left v/m; pts wife had c section and is to be d/c from hospital on 05/27/16; once pts wife and baby are home pt will not be able to leave home easily and pt has dropped off concerta and methylphenidate rxs to [facility] on [address]. Pt request to pick up both rx prior to 05/27/16 while wife and baby are still in hospital. On rx states to fill on or after 05/30/16. Pt request cb.

## 2016-05-25 NOTE — Telephone Encounter (Addendum)
Please call the pharmacy and see if they can allow early refill on 05/27/16.  I would greatly appreciate it if they would.  Please tell patient I said congrats.   Thanks.

## 2016-05-25 NOTE — Telephone Encounter (Signed)
Gave authorization to Maureen RalphsVivian to allow refill 05-26-16 because the pt actually asked for it to be filled prior to 05-27-16.

## 2016-05-25 NOTE — Telephone Encounter (Signed)
[facility] [address] left v/m requesting cb about Concerta ad methylphenidate rx being filled early.

## 2016-05-26 DIAGNOSIS — G43009 Migraine without aura, not intractable, without status migrainosus: Secondary | ICD-10-CM | POA: Diagnosis not present

## 2016-05-26 DIAGNOSIS — F909 Attention-deficit hyperactivity disorder, unspecified type: Secondary | ICD-10-CM | POA: Insufficient documentation

## 2016-05-26 DIAGNOSIS — F17210 Nicotine dependence, cigarettes, uncomplicated: Secondary | ICD-10-CM | POA: Diagnosis not present

## 2016-05-26 DIAGNOSIS — G43909 Migraine, unspecified, not intractable, without status migrainosus: Secondary | ICD-10-CM | POA: Diagnosis present

## 2016-05-27 ENCOUNTER — Encounter (HOSPITAL_COMMUNITY): Payer: Self-pay | Admitting: Emergency Medicine

## 2016-05-27 ENCOUNTER — Emergency Department (HOSPITAL_COMMUNITY)
Admission: EM | Admit: 2016-05-27 | Discharge: 2016-05-27 | Disposition: A | Discharge status: Home / Self Care | Payer: BLUE CROSS/BLUE SHIELD | Attending: Emergency Medicine | Admitting: Emergency Medicine

## 2016-05-27 DIAGNOSIS — G43009 Migraine without aura, not intractable, without status migrainosus: Secondary | ICD-10-CM

## 2016-05-27 MED ORDER — DEXAMETHASONE SODIUM PHOSPHATE 10 MG/ML IJ SOLN
10.0000 mg | Freq: Once | INTRAMUSCULAR | Status: AC
  Administered 2016-05-27: 10 mg via INTRAVENOUS
  Filled 2016-05-27: qty 1

## 2016-05-27 MED ORDER — SODIUM CHLORIDE 0.9 % IV BOLUS (SEPSIS)
1000.0000 mL | Freq: Once | INTRAVENOUS | Status: AC
  Administered 2016-05-27: 1000 mL via INTRAVENOUS

## 2016-05-27 MED ORDER — OXYCODONE-ACETAMINOPHEN 5-325 MG PO TABS
1.0000 | ORAL_TABLET | ORAL | Status: AC | PRN
  Administered 2016-05-27 (×2): 1 via ORAL
  Filled 2016-05-27 (×2): qty 1

## 2016-05-27 MED ORDER — METOCLOPRAMIDE HCL 5 MG/ML IJ SOLN
10.0000 mg | Freq: Once | INTRAMUSCULAR | Status: AC
  Administered 2016-05-27: 10 mg via INTRAVENOUS
  Filled 2016-05-27: qty 2

## 2016-05-27 MED ORDER — DIPHENHYDRAMINE HCL 50 MG/ML IJ SOLN
25.0000 mg | Freq: Once | INTRAMUSCULAR | Status: AC
  Administered 2016-05-27: 25 mg via INTRAVENOUS
  Filled 2016-05-27: qty 1

## 2016-05-27 NOTE — Discharge Instructions (Signed)
Drink plenty of fluids. Try to get some rest. Recheck as needed.

## 2016-05-27 NOTE — ED Notes (Signed)
Pt states that he has a migraine pain 10/10 and has gotten worse throughout the day. Pt states that he has a hx of mirgrane and is able to manage pain but recently just had a baby and has been at the hospital with his spouse unable to take medications for management

## 2016-05-27 NOTE — ED Triage Notes (Signed)
Pt is c/o migraine that started around 6pm  Pt states he has taken 6 tylenol since it started and has not gotten any relief  Pt states he is sensitive to light
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
## 2016-07-24 ENCOUNTER — Ambulatory Visit (INDEPENDENT_AMBULATORY_CARE_PROVIDER_SITE_OTHER): Payer: BLUE CROSS/BLUE SHIELD | Admitting: Family Medicine

## 2016-07-24 ENCOUNTER — Encounter: Payer: Self-pay | Admitting: Family Medicine

## 2016-07-24 VITALS — BP 130/100 | HR 114 | Temp 97.8°F | Wt 258.5 lb

## 2016-07-24 DIAGNOSIS — G4700 Insomnia, unspecified: Secondary | ICD-10-CM | POA: Diagnosis not present

## 2016-07-24 DIAGNOSIS — R030 Elevated blood-pressure reading, without diagnosis of hypertension: Secondary | ICD-10-CM

## 2016-07-24 DIAGNOSIS — F988 Other specified behavioral and emotional disorders with onset usually occurring in childhood and adolescence: Secondary | ICD-10-CM

## 2016-07-24 LAB — BASIC METABOLIC PANEL
BUN: 9 mg/dL (ref 6–23)
CHLORIDE: 105 meq/L (ref 96–112)
CO2: 26 mEq/L (ref 19–32)
CREATININE: 0.79 mg/dL (ref 0.40–1.50)
Calcium: 9.2 mg/dL (ref 8.4–10.5)
GFR: 118.28 mL/min (ref >60.00)
Glucose, Bld: 98 mg/dL (ref 70–99)
POTASSIUM: 3.9 meq/L (ref 3.5–5.1)
Sodium: 137 mEq/L (ref 135–145)

## 2016-07-24 LAB — LIPID PANEL
CHOL/HDL RATIO: 6
Cholesterol: 257 mg/dL — ABNORMAL HIGH (ref 0–200)
HDL: 40.3 mg/dL (ref >39.00)
NONHDL: 216.92
Triglycerides: 347 mg/dL — ABNORMAL HIGH (ref 0.0–149.0)
VLDL: 69.4 mg/dL — AB (ref 0.0–40.0)

## 2016-07-24 LAB — LDL CHOLESTEROL, DIRECT: Direct LDL: 152 mg/dL

## 2016-07-24 MED ORDER — METHYLPHENIDATE HCL ER (OSM) 54 MG PO TBCR: 108.0000 mg | EXTENDED_RELEASE_TABLET | ORAL | 0 refills | Status: DC

## 2016-07-24 MED ORDER — METHYLPHENIDATE HCL 20 MG PO TABS: 20.0000 mg | ORAL_TABLET | Freq: Every evening | ORAL | 0 refills | Status: DC

## 2016-07-24 NOTE — Progress Notes (Signed)
Daughter is about 302 months old and doing well.  They are starting to get some sleep.    Smoking d/w pt.  Trying to limit use, not near his child, not indoors.  D/w pt.   ADD.  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  No ADE on meds.  Able to concentrate better with med.  He can tell when he is off med, when missed a dose.  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  No tremor.  Appetite changes noted but he has adjusted.  He takes med after breakfast, that seems to work better for patient.    He has been putting off his hernia repair due to work and child care needs.  He has seen surgery prev.  D/w pt.    Some insomnia, now with rare use of trazodone.  No ADE on med.  Not consistently used.    Elevated BP noted, fasting, due for routine labs.    Meds, vitals, and allergies reviewed.   ROS: Per HPI unless specifically indicated in ROS section   GEN: nad, alert and oriented HEENT: mucous membranes moist NECK: supple w/o LA CV: rrr.  PULM: ctab, no inc wob ABD: soft, +bs, hernia noted but soft.   EXT: no edema SKIN: no acute rash CN 2-12 wnl B, S/S wnl x4 grossly.

## 2016-07-24 NOTE — Patient Instructions (Signed)
Go to the lab on the way out.  We'll contact you with your lab report. Don't change your meds for now.  Take care.  Glad to see you.  

## 2016-07-27 ENCOUNTER — Other Ambulatory Visit: Payer: Self-pay | Admitting: Family Medicine

## 2016-07-27 DIAGNOSIS — R030 Elevated blood-pressure reading, without diagnosis of hypertension: Secondary | ICD-10-CM | POA: Insufficient documentation

## 2016-07-27 DIAGNOSIS — E781 Pure hyperglyceridemia: Secondary | ICD-10-CM

## 2016-07-27 NOTE — Assessment & Plan Note (Signed)
Controlled, improved with med.  Continue as is.  D/w pt.  He agrees.  No illicits.  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  rx given to patient. Smoking cessation d/w pt.

## 2016-07-27 NOTE — Assessment & Plan Note (Signed)
D/w pt about diet and exercise and weight.  See notes on labs.

## 2016-07-27 NOTE — Assessment & Plan Note (Signed)
Continue trazodone.  Able to tolerate as is.  No ADE on med.

## 2016-08-20 ENCOUNTER — Telehealth: Payer: Self-pay | Admitting: Family Medicine

## 2016-08-20 MED ORDER — OSELTAMIVIR PHOSPHATE 75 MG PO CAPS: 75.0000 mg | ORAL_CAPSULE | Freq: Every day | ORAL | 0 refills | Status: DC

## 2016-08-20 NOTE — Telephone Encounter (Signed)
Spoke with patient, his wife was diagnosed this am with Flu.  Pt has no symptoms at this time, but has a newborn at home and is requesting Tamiflu.  Pharmacy of choice is the [facility] on [address]/ [address] in [place].  Best number to call with any questions is [phone]

## 2016-08-20 NOTE — Telephone Encounter (Signed)
Start qd dosing, if flu sx then change to BID dosing.  Sent.  Thanks.   Hope they all feel better soon.

## 2016-08-20 NOTE — Telephone Encounter (Signed)
Pt notified Rx sent and advise pt of Dr. Lianne Bushyuncan's comments regarding dosing

## 2016-09-14 ENCOUNTER — Other Ambulatory Visit: Payer: Self-pay | Admitting: Family Medicine

## 2016-09-14 NOTE — Telephone Encounter (Signed)
Left message on voicemail for patient to call back. 

## 2016-10-19 ENCOUNTER — Other Ambulatory Visit: Payer: Self-pay

## 2016-10-19 NOTE — Telephone Encounter (Signed)
Pt left v/m requesting rx concerta (last printed # 60  X 2 on 07/24/16) and ritalin (last printed # 30 x 2  on 07/24/16). Last seen 07/24/16. Call pt when ready for pick up.

## 2016-10-20 ENCOUNTER — Encounter: Payer: Self-pay | Admitting: Family Medicine

## 2016-10-20 MED ORDER — METHYLPHENIDATE HCL ER (OSM) 54 MG PO TBCR: 108.0000 mg | EXTENDED_RELEASE_TABLET | ORAL | 0 refills | Status: DC

## 2016-10-20 MED ORDER — METHYLPHENIDATE HCL 20 MG PO TABS
20.0000 mg | ORAL_TABLET | Freq: Every evening | ORAL | 0 refills | Status: DC
Start: 2016-10-20 — End: 2017-03-08

## 2016-10-20 MED ORDER — METHYLPHENIDATE HCL ER (OSM) 54 MG PO TBCR
108.0000 mg | EXTENDED_RELEASE_TABLET | ORAL | 0 refills | Status: DC
Start: 2016-10-20 — End: 2017-03-08

## 2016-10-20 MED ORDER — METHYLPHENIDATE HCL 20 MG PO TABS: 20.0000 mg | ORAL_TABLET | Freq: Every evening | ORAL | 0 refills | Status: DC

## 2016-10-20 NOTE — Telephone Encounter (Signed)
Spoke to pt and informed him Rx is available for pickup from the front desk. Pt advised third party unable to pickup .  

## 2016-10-20 NOTE — Telephone Encounter (Signed)
Printed.  Thanks.  

## 2016-10-21 ENCOUNTER — Encounter: Payer: Self-pay | Admitting: Family Medicine

## 2017-03-08 ENCOUNTER — Telehealth: Payer: Self-pay | Admitting: Family Medicine

## 2017-03-08 ENCOUNTER — Ambulatory Visit (INDEPENDENT_AMBULATORY_CARE_PROVIDER_SITE_OTHER): Payer: Self-pay | Admitting: Family Medicine

## 2017-03-08 ENCOUNTER — Encounter: Payer: Self-pay | Admitting: Family Medicine

## 2017-03-08 VITALS — BP 122/96 | HR 100 | Temp 97.6°F | Wt 258.8 lb

## 2017-03-08 DIAGNOSIS — F988 Other specified behavioral and emotional disorders with onset usually occurring in childhood and adolescence: Secondary | ICD-10-CM

## 2017-03-08 DIAGNOSIS — F1020 Alcohol dependence, uncomplicated: Secondary | ICD-10-CM | POA: Insufficient documentation

## 2017-03-08 MED ORDER — BUPROPION HCL 75 MG PO TABS: 75.0000 mg | ORAL_TABLET | Freq: Two times a day (BID) | ORAL | 0 refills | Status: DC

## 2017-03-08 NOTE — Patient Instructions (Signed)
Let me consider some options in the meantime.  We'll be in touch. Take care.  Glad to see you.

## 2017-03-08 NOTE — Assessment & Plan Note (Signed)
Needs treatment, wife is supportive. No suicidal or homicidal intent. At this point appears okay for outpatient follow-up. Encourage some form of organized treatment, meetings, outpatient treatment, inpatient treatment, etc. He is pre-contemplative. Discussed with him about not stopping alcohol cold [place] and trying to limit his intake in the meantime. >25 minutes spent in face to face time with patient, >50% spent in counselling or coordination of care.

## 2017-03-08 NOTE — Telephone Encounter (Signed)
Notify patient, considered options. The main issue here is still getting treatment for alcohol use. Everything else likely secondary to that. Let me know if/when I can help him get some treatment for alcohol use. In the meantime likely reasonable to try Wellbutrin to see if it helps his mood and potentially helps his concentration. Prescription sent. Start with 1 pill a day, increase to 1 pill twice a day after about 5 days. Please let me know how he is doing in a few days, sooner if needed. Thanks.

## 2017-03-08 NOTE — Progress Notes (Signed)
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  "It's the best thing I've ever done."   However- now with Etoh use.  "I've been drinking."  Up to 16 drinks at night.  Started back about 6 months ago.  "I'm functional."   Wife is supportive.  Taking trazodone rarely, as needed.  He didn't think he could taper down well/easily.  No illicits.  He isn't set on quitting yet.  No SI/HI.  We discussed options about treatment.  He may consider meetings in the meantime and he may want to taper slowly.  No tremor noted by patient.    He was asking about nonstimulant ADD meds.  He was prev on concerta.  He has noted some depression and anxiety recently.  D/w pt.  He contracts for safety.  No SZ hx.    His brother recently overdosed.  D/w pt.  Elevated BP out of clinic, d/w pt.   Smoking more since being off ADD meds.  D/w pt.  Work stressors noted, d/w pt.  He is trying to work through the situation at his job    ROS: Per HPI unless specifically indicated in ROS section   Meds, vitals, and allergies reviewed.   GEN: nad, alert and oriented, affect wnl and appropriate, speech wnl HEENT: mucous membranes moist NECK: supple w/o LA CV: rrr.  PULM: ctab, no inc wob ABD: soft, +bs EXT: no edema No tremor.

## 2017-03-08 NOTE — Assessment & Plan Note (Signed)
Off stimulant medication. Attention is worse. His mood is worse recently. No suicidal or homicidal intent. There are some potential issues with non-stimulant medications for ADD and worsening mood. Discussed with patient. Wellbutrin may be the best option for his mood, acknowledging that it is off label for ADD. See following phone note. Discussed with patient at office visit about potential considerations.

## 2017-03-09 NOTE — Telephone Encounter (Signed)
Patient advised.

## 2017-12-29 ENCOUNTER — Ambulatory Visit: Payer: Self-pay | Admitting: Family Medicine

## 2018-01-10 ENCOUNTER — Other Ambulatory Visit: Payer: Self-pay | Admitting: Family Medicine

## 2018-01-10 DIAGNOSIS — E781 Pure hyperglyceridemia: Secondary | ICD-10-CM

## 2018-01-11 ENCOUNTER — Other Ambulatory Visit (INDEPENDENT_AMBULATORY_CARE_PROVIDER_SITE_OTHER): Payer: No Typology Code available for payment source

## 2018-01-11 DIAGNOSIS — E781 Pure hyperglyceridemia: Secondary | ICD-10-CM

## 2018-01-11 LAB — LIPID PANEL
Cholesterol: 254 mg/dL — ABNORMAL HIGH (ref 0–200)
HDL: 29.2 mg/dL — AB (ref >39.00)
NONHDL: 224.33
Total CHOL/HDL Ratio: 9
Triglycerides: 309 mg/dL — ABNORMAL HIGH (ref 0.0–149.0)
VLDL: 61.8 mg/dL — AB (ref 0.0–40.0)

## 2018-01-11 LAB — CBC WITH DIFFERENTIAL/PLATELET
BASOS PCT: 0.5 % (ref 0.0–3.0)
Basophils Absolute: 0 10*3/uL (ref 0.0–0.1)
EOS ABS: 0.2 10*3/uL (ref 0.0–0.7)
EOS PCT: 2.6 % (ref 0.0–5.0)
HCT: 47.2 % (ref 39.0–52.0)
Hemoglobin: 16.3 g/dL (ref 13.0–17.0)
LYMPHS ABS: 2.6 10*3/uL (ref 0.7–4.0)
Lymphocytes Relative: 30 % (ref 12.0–46.0)
MCHC: 34.5 g/dL (ref 30.0–36.0)
MCV: 91.3 fl (ref 78.0–100.0)
MONO ABS: 0.9 10*3/uL (ref 0.1–1.0)
Monocytes Relative: 10.4 % (ref 3.0–12.0)
NEUTROS PCT: 56.5 % (ref 43.0–77.0)
Neutro Abs: 4.9 10*3/uL (ref 1.4–7.7)
PLATELETS: 242 10*3/uL (ref 150.0–400.0)
RBC: 5.17 Mil/uL (ref 4.22–5.81)
RDW: 13.1 % (ref 11.5–15.5)
WBC: 8.6 10*3/uL (ref 4.0–10.5)

## 2018-01-11 LAB — COMPREHENSIVE METABOLIC PANEL
ALT: 21 U/L (ref 0–53)
AST: 14 U/L (ref 0–37)
Albumin: 4.4 g/dL (ref 3.5–5.2)
Alkaline Phosphatase: 109 U/L (ref 39–117)
BUN: 13 mg/dL (ref 6–23)
CHLORIDE: 105 meq/L (ref 96–112)
CO2: 24 mEq/L (ref 19–32)
CREATININE: 0.71 mg/dL (ref 0.40–1.50)
Calcium: 9.4 mg/dL (ref 8.4–10.5)
GFR: 132.69 mL/min (ref >60.00)
GLUCOSE: 105 mg/dL — AB (ref 70–99)
POTASSIUM: 4.1 meq/L (ref 3.5–5.1)
SODIUM: 137 meq/L (ref 135–145)
Total Bilirubin: 0.3 mg/dL (ref 0.2–1.2)
Total Protein: 7.3 g/dL (ref 6.0–8.3)

## 2018-01-11 LAB — LDL CHOLESTEROL, DIRECT: LDL DIRECT: 176 mg/dL

## 2018-01-21 ENCOUNTER — Ambulatory Visit (INDEPENDENT_AMBULATORY_CARE_PROVIDER_SITE_OTHER): Payer: No Typology Code available for payment source | Admitting: Family Medicine

## 2018-01-21 ENCOUNTER — Encounter: Payer: Self-pay | Admitting: Family Medicine

## 2018-01-21 VITALS — BP 110/78 | HR 98 | Temp 98.4°F | Ht 70.0 in | Wt 236.5 lb

## 2018-01-21 DIAGNOSIS — F1020 Alcohol dependence, uncomplicated: Secondary | ICD-10-CM

## 2018-01-21 DIAGNOSIS — E781 Pure hyperglyceridemia: Secondary | ICD-10-CM

## 2018-01-21 DIAGNOSIS — Z0000 Encounter for general adult medical examination without abnormal findings: Secondary | ICD-10-CM

## 2018-01-21 DIAGNOSIS — M7662 Achilles tendinitis, left leg: Secondary | ICD-10-CM

## 2018-01-21 DIAGNOSIS — Z7189 Other specified counseling: Secondary | ICD-10-CM

## 2018-01-21 DIAGNOSIS — M7661 Achilles tendinitis, right leg: Secondary | ICD-10-CM

## 2018-01-21 DIAGNOSIS — J309 Allergic rhinitis, unspecified: Secondary | ICD-10-CM

## 2018-01-21 DIAGNOSIS — M7660 Achilles tendinitis, unspecified leg: Secondary | ICD-10-CM

## 2018-01-21 NOTE — Patient Instructions (Addendum)
Try walking for exercise but use heel lifts and gently stretch your calves.   Ice your heels for 5 minutes.   Update me as needed.   I would get a flu shot each fall.

## 2018-01-21 NOTE — Progress Notes (Signed)
CPE- See plan.  Routine anticipatory guidance given to patient.  See health maintenance.  The possibility exists that previously documented standard health maintenance information may have been brought forward from a previous encounter into this note.  If needed, that same information has been updated to reflect the current situation based on today's encounter.   Tetanus 2010 Flu shot encouraged.   Shingles and PNA not due.  Colon and prostate cancer screening not due.   Living will d/w pt.  Wife designated if patient incapacitated.   Diet and exercise d/w pt along with labs.   Smoking d/w pt.  Precontemplative.  He is considering options to quit, discussed.   [facility]  Attention d/w pt.  He is putting off med tx until he has 1 year sobriety.  He is working to manage off meds but still has sx.  D/w pt.    HA improved with flonase- sig environmental allergy hx  He has been sober for 9 months.  D/w pt.  His insomnia is better.  I encouraged him to maintain and continue his sobriety.  He is going to meetings.  He has a sponsor.  His nephew has been visiting for 2 weeks.  Unclear if the nephew will be able to stay with patient for the school year.  D/w pt.    B heel pain w/o trauma.  Posterior heel pain.  Going on for months.  Discomfort with walking.  He does not have plantar pain.  PMH and SH reviewed  Meds, vitals, and allergies reviewed.   ROS: Per HPI.  Unless specifically indicated otherwise in HPI, the patient denies:  General: fever. Eyes: acute vision changes ENT: sore throat Cardiovascular: chest pain Respiratory: SOB GI: vomiting GU: dysuria Musculoskeletal: acute back pain Derm: acute rash Neuro: acute motor dysfunction Psych: worsening mood Endocrine: polydipsia Heme: bleeding Allergy: hayfever  GEN: nad, alert and oriented HEENT: mucous membranes moist NECK: supple w/o LA CV: rrr. PULM: ctab, no inc wob ABD: soft, +bs EXT: no  edema SKIN: no acute rash Plantar fascia not tender bilaterally but he has tenderness at the insertion of the Achilles tendon at the calcaneus.  No local redness.  Able to bear weight.

## 2018-01-23 DIAGNOSIS — M7660 Achilles tendinitis, unspecified leg: Secondary | ICD-10-CM | POA: Insufficient documentation

## 2018-01-23 NOTE — Assessment & Plan Note (Signed)
Similar symptoms and issues in both feet.  Discussed with him about stretching the Achilles tendon and his calf muscles along with using appropriate foot wear and a heel lift.  Update me as needed.  He agrees.  See after visit summary.  Icing will help.

## 2018-01-23 NOTE — Assessment & Plan Note (Signed)
Tetanus 2010 Flu shot encouraged.   Shingles and PNA not due.  Colon and prostate cancer screening not due.   Living will d/w pt.  Wife designated if patient incapacitated.   Diet and exercise d/w pt along with labs.   Smoking d/w pt.  Precontemplative.  He is considering options to quit, discussed.   [facility]

## 2018-01-23 NOTE — Assessment & Plan Note (Signed)
He has 9 months of sobriety currently.  Encouraged.  He is still going to meetings and has a sponsor.  He will update me as needed.

## 2018-01-23 NOTE — Assessment & Plan Note (Signed)
He is putting off treatment until he has at least 1 year of sobriety.  This is reasonable.  He is trying to compensate as much as he can in the meantime.

## 2018-01-23 NOTE — Assessment & Plan Note (Signed)
Wife designated if patient were incapacitated.  

## 2018-01-23 NOTE — Assessment & Plan Note (Signed)
His lipids are elevated but I am more worried about him maintaining sobriety and working on smoking.  Discussed.  He agrees.  On diet and exercise as best he can.  He will try to address his Achilles tendinitis and then restart walking for exercise.  He agrees with plan.

## 2018-01-23 NOTE — Assessment & Plan Note (Signed)
Continue Flonase.  Update me as needed. 

## 2018-05-23 ENCOUNTER — Ambulatory Visit: Payer: No Typology Code available for payment source | Admitting: Family Medicine

## 2018-05-23 ENCOUNTER — Encounter: Payer: Self-pay | Admitting: Family Medicine

## 2018-05-23 DIAGNOSIS — H109 Unspecified conjunctivitis: Secondary | ICD-10-CM

## 2018-05-23 MED ORDER — POLYMYXIN B-TRIMETHOPRIM 10000-0.1 UNIT/ML-% OP SOLN: 1.0000 [drp] | OPHTHALMIC | 0 refills | Status: DC

## 2018-05-23 NOTE — Patient Instructions (Signed)
Presumed pinkeye.   Usually transmitted by direct contact.  Handwashing dramatically decreases transmission.  Usually not contagious when discharge is gone but some irritation can persist.  Likely much less contagious after 2 days of drops.  Should be able to go to work tomorrow.  Take care.  Glad to see you.

## 2018-05-23 NOTE — Progress Notes (Signed)
Left eye

## 2018-05-23 NOTE — Progress Notes (Signed)
Daughter had pinkeye.  That is resolved but then she had a fever this AM.  She is being treated for ear infection.   Now with patient with L eye sx, started about 2 days ago.  Then had drainage, discharge.  Crusted in the AM.  Started polymyxin/trimethoprim eyedrops in the meantime with some improvement.  No R eye sx.    No vision changes unless occluded by discharge.  Drainage is getting better.  No FCNAVD.    His brother's kids are living with him.  And his wife is pregnant with their second child (they found out 10 days ago).  Mood d/w pt.  He is in marriage counseling.  He doesn't qualify for food stamps, "and we're going to make it."  I asked him to update me as needed.  He agreed.  Meds, vitals, and allergies reviewed.   ROS: Per HPI unless specifically indicated in ROS section   No apparent distress. Speech normal. Mood and affect appear normal. Normocephalic atraumatic Tympanic membranes within normal limits bilaterally. Pupils equally round and reactive to light. Right conjunctiva within normal limits. Bilateral upper and lower lids within normal limits. Left conjunctiva irritated, medial greater than lateral, no discharge at office visit. Nasal and oropharyngeal exam unremarkable. Neck supple, no lymphadenopathy.

## 2018-05-24 DIAGNOSIS — H109 Unspecified conjunctivitis: Secondary | ICD-10-CM | POA: Insufficient documentation

## 2018-05-24 NOTE — Assessment & Plan Note (Signed)
Reasonable to continue polymyxin/trimethoprim eyedrops.  He appears to be improving.  Routine cautions discussed with patient.  He should be able to go back to work soon.  See after visit summary.  Update me as needed.  About his situation at home overall.  Support offered.  He will update me as needed.  He agrees.

## 2018-08-05 ENCOUNTER — Ambulatory Visit: Payer: No Typology Code available for payment source | Admitting: Family Medicine

## 2018-08-05 ENCOUNTER — Encounter: Payer: Self-pay | Admitting: Family Medicine

## 2018-08-05 VITALS — BP 112/82 | HR 79 | Temp 98.7°F | Ht 70.0 in | Wt 249.5 lb

## 2018-08-05 DIAGNOSIS — R059 Cough, unspecified: Secondary | ICD-10-CM

## 2018-08-05 DIAGNOSIS — Z659 Problem related to unspecified psychosocial circumstances: Secondary | ICD-10-CM

## 2018-08-05 DIAGNOSIS — R05 Cough: Secondary | ICD-10-CM | POA: Diagnosis not present

## 2018-08-05 LAB — POC INFLUENZA A&B (BINAX/QUICKVUE)
Influenza A, POC: NEGATIVE
Influenza B, POC: NEGATIVE

## 2018-08-05 MED ORDER — BENZONATATE 200 MG PO CAPS: 200.0000 mg | ORAL_CAPSULE | Freq: Three times a day (TID) | ORAL | 1 refills | Status: DC | PRN

## 2018-08-05 NOTE — Patient Instructions (Addendum)
Take tylenol for the aches.   Take enough fluids to keep your urine clear.  Rest in the meantime.   If you have a cough, then use tessalon.  Restart flonase.  Gently try to pop your ears Take care.  Glad to see you.  If worse then let me know.    If your mood isn't better, then let me know.

## 2018-08-05 NOTE — Progress Notes (Signed)
He is looking after his brother's kids and his wife is pregnant. This is a strain for the patient.  He is still going to meetings.  He has been sober for >1 year, with plans to maintain sobriety.  Discussed and encouraged.  No Si/Hi.  He was considering if he was at the point of wanting treatment for depression but that is potentially confounded by the recent illness and stressors.  He wanted to get through this recent illness and then update me as needed.  That seems reasonable.  duration of symptoms: since yesterday.  Mult sick contacts.   Rhinorrhea: no Congestion: yes ear pain: R ear congestion.   sore throat: no Cough: no Myalgias: yes, lower back  Fevers: yes, ~100 degrees. Uncomfortable abdominal sensation, but not vomiting or diarrhea, but that resolved.  Got his appetite back in the meantime.  Per HPI unless specifically indicated in ROS section  Meds, vitals, and allergies reviewed.   GEN: nad, alert and oriented HEENT: mucous membranes moist, TM w/o erythema, nasal epithelium injected, OP with cobblestoning NECK: supple w/o LA CV: rrr. PULM: ctab, no inc wob ABD: soft, +bs EXT: no edema R SOM noted.  Sinuses not ttp.

## 2018-08-07 DIAGNOSIS — Z659 Problem related to unspecified psychosocial circumstances: Secondary | ICD-10-CM | POA: Insufficient documentation

## 2018-08-07 DIAGNOSIS — R05 Cough: Secondary | ICD-10-CM | POA: Insufficient documentation

## 2018-08-07 DIAGNOSIS — R059 Cough, unspecified: Secondary | ICD-10-CM | POA: Insufficient documentation

## 2018-08-07 NOTE — Assessment & Plan Note (Signed)
Likely a benign, non-flu, viral illness. Discussed options Take tylenol for the aches.   Take enough fluids to keep urine clear.  Rest in the meantime.   If you have a cough, then use tessalon.  Restart flonase.  Gently try to pop your ears If worse then let me know.

## 2018-08-07 NOTE — Assessment & Plan Note (Signed)
Maintaining sobriety.  Okay for outpatient follow-up. If mood isn't better with resolution of viral illness, then he will let me know.  He agrees.

## 2018-09-06 ENCOUNTER — Other Ambulatory Visit: Payer: Self-pay | Admitting: *Deleted

## 2018-09-06 ENCOUNTER — Telehealth: Payer: Self-pay | Admitting: *Deleted

## 2018-09-06 MED ORDER — OSELTAMIVIR PHOSPHATE 75 MG PO CAPS: 75.0000 mg | ORAL_CAPSULE | Freq: Every day | ORAL | 0 refills | Status: DC

## 2018-09-06 NOTE — Telephone Encounter (Signed)
Patient advised.

## 2018-09-06 NOTE — Telephone Encounter (Signed)
Patient called stating that someone in is home is being treated for the flu. Jorge Francis stated that their doctor told him he should call his PCP to see if he would prescribe Tamiflu for him. Pharnacy [facility].

## 2018-09-06 NOTE — Telephone Encounter (Signed)
Start with QD dosing if no flu sx.  If flu sx, then change to BID.   Hope they feel better soon.  rx sent.

## 2018-09-23 ENCOUNTER — Ambulatory Visit: Payer: No Typology Code available for payment source | Admitting: Family Medicine

## 2018-09-23 ENCOUNTER — Telehealth: Payer: Self-pay

## 2018-09-23 ENCOUNTER — Encounter: Payer: Self-pay | Admitting: Family Medicine

## 2018-09-23 ENCOUNTER — Other Ambulatory Visit: Payer: Self-pay

## 2018-09-23 DIAGNOSIS — J069 Acute upper respiratory infection, unspecified: Secondary | ICD-10-CM

## 2018-09-23 NOTE — Progress Notes (Signed)
His wife is persistently fatigued and not feeling well with pregnancy.  He is still caring for his niece and nephew, but they will go back to live with his father later this summer.    Sick for about 3 days.  He had chills, temp 100 at that point.  Next AM his cough started.  Cough more at night.  Then green nasal discharge, less midday, more in the evening.  Some diarrhea, no vomiting.    Not smoking now, has been going to meetings and isn't drinking.  Some AM sputum while sick.    Daughter with similar recently.    No known foreign travel, no known coronavirus exposure.    Last fever was 3 nights ago.    Meds, vitals, and allergies reviewed.   ROS: Per HPI unless specifically indicated in ROS section   GEN: nad, alert and oriented HEENT: mucous membranes moist, tm w/o erythema, bilateral SOM noted, nasal exam w/o erythema, clear discharge noted,  OP with cobblestoning, sinuses not tender to palpation. NECK: supple w/o LA CV: rrr.   PULM: ctab, no inc wob EXT: no edema SKIN: no acute rash

## 2018-09-23 NOTE — Patient Instructions (Signed)
Likely a nonflu limited viral illness that should resolve.  Rest and fluids, flonase, tylenol if needed.  Update me as needed.  Take care.  Glad to see you.

## 2018-09-23 NOTE — Telephone Encounter (Signed)
On 03/10/20t had fever 100; blows nose with green mucus, prod cough with green phlegm,tired, does get winded if goes up steps; sitting no SOB; NO fever today. Has not traveled in last 14 days but co worker recently was in [place] and that Cabin crew has been coughing but that person is overweight and coughs all the time so no new symptoms. Pt pts knowledge has not come in contact with corona virus or flu. pts employer advised pt to be seen. pts 2 yo daughter has similar symptoms and was seen 09/22/18 and dx viral infection. Pt scheduled appt 09/22/28 at 3 pm with ED precautions. FYI to Dr Para March.

## 2018-09-23 NOTE — Telephone Encounter (Signed)
Will see at OV.  Thanks.  

## 2018-09-25 NOTE — Assessment & Plan Note (Signed)
Likely a nonflu limited viral illness that should resolve.  Rest and fluids, flonase, tylenol if needed.  Update me as needed.   He agrees with plan.  Okay for outpatient follow-up.  We talked about his situation on the home front.  He is managing sobriety.  He is supportive of his wife and helping out with multiple family members.  Encouragement offered.

## 2018-11-18 ENCOUNTER — Encounter: Payer: Self-pay | Admitting: Family Medicine

## 2018-11-18 ENCOUNTER — Ambulatory Visit (INDEPENDENT_AMBULATORY_CARE_PROVIDER_SITE_OTHER): Payer: BLUE CROSS/BLUE SHIELD | Admitting: Family Medicine

## 2018-11-18 DIAGNOSIS — F411 Generalized anxiety disorder: Secondary | ICD-10-CM

## 2018-11-18 MED ORDER — VENLAFAXINE HCL 37.5 MG PO TABS: ORAL_TABLET | ORAL | 3 refills | Status: DC

## 2018-11-18 NOTE — Progress Notes (Signed)
Virtual visit completed through WebEx or similar program Patient location: home  Provider location: [place] at [facility], office   Limitations and rationale for visit method d/w patient.  Patient agreed to proceed.   CC: mood changes  HPI:  Eye sx resolved in the meantime.    He was working through a back log of online orders at work. Pandemic considerations d/w pt.   His brother's kids are out of the house and back with other family members. Discussed.    His wife is 8 months pregnant.  He is working to compensate by taking on extra responsibilities.  He is more anxious in the meantime.  He has increased distraction and trouble with recall, he his having to take more notes.    He is still going to meetings at night, on line.  He is maintaining sobriety.  No SI/HI.  His wife is in therapy and she has talked to her doc about her mood.  She is scheduled for induction prior to the end of the month.  Mother in law is going to come in at the birth of the child.  Situation discussed  Meds and allergies reviewed.   ROS: Per HPI unless specifically indicated in ROS section   NAD Speech wnl  A/P: Anxiety.  Maintaining sobriety.  No suicidal or homicidal intent.  Discussed options.  He was at the point of wanting to get medical treatment.  Reasonable to start Effexor.  He may be able to tolerate this and the effect may be noted sooner than with typical SSRI response.  Avoid benzodiazepines.  Continue to abstain from alcohol.  He has support with meetings.  He will update me in about 1 week, sooner if needed.

## 2018-11-20 NOTE — Assessment & Plan Note (Signed)
Maintaining sobriety.  No suicidal or homicidal intent.  Discussed options.  He was at the point of wanting to get medical treatment.  Reasonable to start Effexor.  He may be able to tolerate this and the effect may be noted sooner than with typical SSRI response.  Avoid benzodiazepines.  Continue to abstain from alcohol.  He has support with meetings.  He will update me in about 1 week, sooner if needed

## 2018-12-01 ENCOUNTER — Telehealth: Payer: Self-pay | Admitting: Family Medicine

## 2018-12-01 NOTE — Telephone Encounter (Signed)
Please get update on patient re: mood and let me know.  Thanks.

## 2018-12-02 NOTE — Telephone Encounter (Signed)
Spoke with patient. He said he was sorry that he did not get back to Dr. Para March to let him know how he was doing. Patient has not started medication that was prescribed yet. He is not sure if he will. He wants to try spiritual approach first. Overall symptoms are actually a little better. Patient will update [place] at a later time on how things are going.

## 2018-12-02 NOTE — Telephone Encounter (Signed)
Noted. I'll defer to patient. Glad he is some better.  Thanks.

## 2019-04-10 ENCOUNTER — Telehealth: Payer: Self-pay | Admitting: Family Medicine

## 2019-04-10 DIAGNOSIS — E781 Pure hyperglyceridemia: Secondary | ICD-10-CM

## 2019-04-10 NOTE — Telephone Encounter (Signed)
Patient has a cpx scheduled with Dr.Duncan on 05/02/19.  Patient would like to have his labs drawn at the [facility] office the week before his appointment.  Please put lab orders in Epic for cpx.

## 2019-04-12 NOTE — Telephone Encounter (Signed)
Ordered. Thanks

## 2019-04-25 ENCOUNTER — Other Ambulatory Visit (INDEPENDENT_AMBULATORY_CARE_PROVIDER_SITE_OTHER): Payer: BC Managed Care – PPO

## 2019-04-25 DIAGNOSIS — E781 Pure hyperglyceridemia: Secondary | ICD-10-CM | POA: Diagnosis not present

## 2019-04-25 LAB — COMPREHENSIVE METABOLIC PANEL
ALT: 18 U/L (ref 0–53)
AST: 16 U/L (ref 0–37)
Albumin: 4.6 g/dL (ref 3.5–5.2)
Alkaline Phosphatase: 106 U/L (ref 39–117)
BUN: 10 mg/dL (ref 6–23)
CO2: 25 mEq/L (ref 19–32)
Calcium: 9.6 mg/dL (ref 8.4–10.5)
Chloride: 104 mEq/L (ref 96–112)
Creatinine, Ser: 0.87 mg/dL (ref 0.40–1.50)
GFR: 98.06 mL/min (ref >60.00)
Glucose, Bld: 94 mg/dL (ref 70–99)
Potassium: 4.1 mEq/L (ref 3.5–5.1)
Sodium: 138 mEq/L (ref 135–145)
Total Bilirubin: 0.6 mg/dL (ref 0.2–1.2)
Total Protein: 7.5 g/dL (ref 6.0–8.3)

## 2019-04-25 LAB — LIPID PANEL
Cholesterol: 191 mg/dL (ref 0–200)
HDL: 30.7 mg/dL — ABNORMAL LOW (ref >39.00)
NonHDL: 160.08
Total CHOL/HDL Ratio: 6
Triglycerides: 211 mg/dL — ABNORMAL HIGH (ref 0.0–149.0)
VLDL: 42.2 mg/dL — ABNORMAL HIGH (ref 0.0–40.0)

## 2019-04-25 LAB — LDL CHOLESTEROL, DIRECT: Direct LDL: 140 mg/dL

## 2019-05-02 ENCOUNTER — Ambulatory Visit (INDEPENDENT_AMBULATORY_CARE_PROVIDER_SITE_OTHER): Payer: BC Managed Care – PPO | Admitting: Family Medicine

## 2019-05-02 ENCOUNTER — Other Ambulatory Visit: Payer: Self-pay

## 2019-05-02 ENCOUNTER — Encounter: Payer: Self-pay | Admitting: Family Medicine

## 2019-05-02 VITALS — BP 106/80 | HR 74 | Temp 97.4°F | Ht 70.0 in | Wt 242.1 lb

## 2019-05-02 DIAGNOSIS — M79673 Pain in unspecified foot: Secondary | ICD-10-CM

## 2019-05-02 DIAGNOSIS — Z23 Encounter for immunization: Secondary | ICD-10-CM | POA: Diagnosis not present

## 2019-05-02 DIAGNOSIS — Z7189 Other specified counseling: Secondary | ICD-10-CM

## 2019-05-02 DIAGNOSIS — Z0000 Encounter for general adult medical examination without abnormal findings: Secondary | ICD-10-CM | POA: Diagnosis not present

## 2019-05-02 DIAGNOSIS — F1020 Alcohol dependence, uncomplicated: Secondary | ICD-10-CM

## 2019-05-02 DIAGNOSIS — M722 Plantar fascial fibromatosis: Secondary | ICD-10-CM

## 2019-05-02 DIAGNOSIS — M6530 Trigger finger, unspecified finger: Secondary | ICD-10-CM

## 2019-05-02 NOTE — Progress Notes (Signed)
CPE- See plan.  Routine anticipatory guidance given to patient.  See health maintenance.  The possibility exists that previously documented standard health maintenance information may have been brought forward from a previous encounter into this note.  If needed, that same information has been updated to reflect the current situation based on today's encounter.    Tetanus 2020 Flu shot 2020.   Shingles and PNA not due.  Colon and prostate cancer screening not due.  Living will d/w pt. Wife designated if patient incapacitated.  Diet and exercise d/w pt along with labs. labs improved.  [facility]  Intentional weight loss noted, d/w pt.  Diet and exercise, walking daily.    His nephew (who was previously living with the patient) is back with his brother.   Pt is at home with his wife and 2 daughters- daughter Leonia Reeves Lott Seelbach) born 11/2018.  He is using his steps from AA to [facility] anxiety and maintain sobriety, for which he deserves credit.  He has AA support.    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  Discussed options.  He thought he broke his R2nd toe but that resolved.  This was months ago.  He is "100% better now."    R 1st nail ingrown.  Has been cutting it back for years.  Also with R 1st nail change from likely fungal changes.  Heel pain in spite of arch stretches.  Pain with 1st step.   PMH and SH reviewed  Meds, vitals, and allergies reviewed.   ROS: Per HPI.  Unless specifically indicated otherwise in HPI, the patient denies:  General: fever. Eyes: acute vision changes ENT: sore throat Cardiovascular: chest pain Respiratory: SOB GI: vomiting GU: dysuria Musculoskeletal: acute back pain Derm: acute rash Neuro: acute motor dysfunction Psych: worsening mood Endocrine: polydipsia Heme: bleeding Allergy: hayfever  GEN: nad, alert and oriented HEENT: mucous membranes moist NECK: supple  w/o LA CV: rrr. PULM: ctab, no inc wob ABD: soft, +bs EXT: no edema SKIN: no acute rash R 5th finger triggering with flexion.  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 He has tenderness along the origin of the plantar fascia, consistent with plantar fasciitis.  Achilles tendon, medial malleolus, lateral malleolus not tender.  Normal dorsalis pedis pulse.

## 2019-05-02 NOTE — Patient Instructions (Addendum)
Thank you for your effort.  Congrats on Reba's birth.   If you continue to have trouble with trigger finger, ask about seeing Dr. Lorelei Pont and he may be able to help.  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    Look for shoes with arch support and you may need an extra adhesive arch support.    We'll call about podiatry.  Routine vaccines today.  Take care.  Glad to see you.

## 2019-05-03 DIAGNOSIS — M6530 Trigger finger, unspecified finger: Secondary | ICD-10-CM | POA: Insufficient documentation

## 2019-05-03 DIAGNOSIS — M722 Plantar fascial fibromatosis: Secondary | ICD-10-CM | POA: Insufficient documentation

## 2019-05-03 NOTE — Assessment & Plan Note (Signed)
Living will d/w pt. Wife designated if patient incapacitated.

## 2019-05-03 NOTE — Assessment & Plan Note (Addendum)
Tetanus 2020 Flu shot 2020.   Shingles and PNA not due.  Colon and prostate cancer screening not due.  Living will d/w pt. Wife designated if patient incapacitated.  Diet and exercise d/w pt along with labs. labs improved.  [facility]. Intentional weight loss noted, d/w pt.  Diet and exercise, walking daily.

## 2019-05-03 NOTE — Assessment & Plan Note (Signed)
He is using his steps from AA to [facility] anxiety and maintain sobriety, for which he deserves credit.  He has AA support.

## 2019-05-03 NOTE — Assessment & Plan Note (Signed)
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  If he does not improve on his own then he can see about getting an appointment with sports medicine clinic.  See after visit summary.  He agrees.

## 2019-05-03 NOTE — Assessment & Plan Note (Signed)
Reasonable to get shoes with supportive arches.  He can get extra arch support inserts.  See after visit summary.  Refer to podiatry for this and also for the chronic fungal changes on his nail.  He agrees to plan.

## 2019-05-18 ENCOUNTER — Other Ambulatory Visit: Payer: Self-pay

## 2019-05-18 DIAGNOSIS — Z20822 Contact with and (suspected) exposure to covid-19: Secondary | ICD-10-CM

## 2019-05-20 LAB — NOVEL CORONAVIRUS, NAA: SARS-CoV-2, NAA: NOT DETECTED

## 2019-05-23 ENCOUNTER — Ambulatory Visit: Payer: Self-pay | Admitting: Podiatry

## 2019-05-24 ENCOUNTER — Other Ambulatory Visit: Payer: Self-pay

## 2019-05-24 ENCOUNTER — Ambulatory Visit (INDEPENDENT_AMBULATORY_CARE_PROVIDER_SITE_OTHER): Payer: BC Managed Care – PPO

## 2019-05-24 ENCOUNTER — Encounter: Payer: Self-pay | Admitting: Podiatry

## 2019-05-24 ENCOUNTER — Other Ambulatory Visit: Payer: Self-pay | Admitting: Podiatry

## 2019-05-24 ENCOUNTER — Ambulatory Visit: Payer: BC Managed Care – PPO | Admitting: Podiatry

## 2019-05-24 DIAGNOSIS — M722 Plantar fascial fibromatosis: Secondary | ICD-10-CM | POA: Diagnosis not present

## 2019-05-24 DIAGNOSIS — M79671 Pain in right foot: Secondary | ICD-10-CM

## 2019-05-24 DIAGNOSIS — L600 Ingrowing nail: Secondary | ICD-10-CM

## 2019-05-24 DIAGNOSIS — M79672 Pain in left foot: Secondary | ICD-10-CM

## 2019-05-24 NOTE — Patient Instructions (Addendum)
Soak Instructions    THE DAY AFTER THE PROCEDURE  Place 1/4 cup of epsom salts in a quart of warm tap water.  Submerge your foot or feet with outer bandage intact for the initial soak; this will allow the bandage to become moist and wet for easy lift off.  Once you remove your bandage, continue to soak in the solution for 20 minutes.  This soak should be done twice a day.  Next, remove your foot or feet from solution, blot dry the affected area and cover.  You may use a band aid large enough to cover the area or use gauze and tape.  Apply other medications to the area as directed by the doctor such as polysporin neosporin.  IF YOUR SKIN BECOMES IRRITATED WHILE USING THESE INSTRUCTIONS, IT IS OKAY TO SWITCH TO  WHITE VINEGAR AND WATER. Or you may use antibacterial soap and water to keep the toe clean  Monitor for any signs/symptoms of infection. Call the office immediately if any occur or go directly to the emergency room. Call with any questions/concerns.    Long Term Care Instructions-Post Nail Surgery  You have had your ingrown toenail and root treated with a chemical.  This chemical causes a burn that will drain and ooze like a blister.  This can drain for 6-8 weeks or longer.  It is important to keep this area clean, covered, and follow the soaking instructions dispensed at the time of your surgery.  This area will eventually dry and form a scab.  Once the scab forms you no longer need to soak or apply a dressing.  If at any time you experience an increase in pain, redness, swelling, or drainage, you should contact the office as soon as possible.  Plantar Fasciitis (Heel Spur Syndrome) with Rehab The plantar fascia is a fibrous, ligament-like, soft-tissue structure that spans the bottom of the foot. Plantar fasciitis is a condition that causes pain in the foot due to inflammation of the tissue. SYMPTOMS   Pain and tenderness on the underneath side of the foot.  Pain that worsens with  standing or walking. CAUSES  Plantar fasciitis is caused by irritation and injury to the plantar fascia on the underneath side of the foot. Common mechanisms of injury include:  Direct trauma to bottom of the foot.  Damage to a small nerve that runs under the foot where the main fascia attaches to the heel bone.  Stress placed on the plantar fascia due to bone spurs. RISK INCREASES WITH:   Activities that place stress on the plantar fascia (running, jumping, pivoting, or cutting).  Poor strength and flexibility.  Improperly fitted shoes.  Tight calf muscles.  Flat feet.  Failure to warm-up properly before activity.  Obesity. PREVENTION  Warm up and stretch properly before activity.  Allow for adequate recovery between workouts.  Maintain physical fitness:  Strength, flexibility, and endurance.  Cardiovascular fitness.  Maintain a health body weight.  Avoid stress on the plantar fascia.  Wear properly fitted shoes, including arch supports for individuals who have flat feet.  PROGNOSIS  If treated properly, then the symptoms of plantar fasciitis usually resolve without surgery. However, occasionally surgery is necessary.  RELATED COMPLICATIONS   Recurrent symptoms that may result in a chronic condition.  Problems of the lower back that are caused by compensating for the injury, such as limping.  Pain or weakness of the foot during push-off following surgery.  Chronic inflammation, scarring, and partial or complete fascia tear,   occurring more often from repeated injections.  TREATMENT  Treatment initially involves the use of ice and medication to help reduce pain and inflammation. The use of strengthening and stretching exercises may help reduce pain with activity, especially stretches of the Achilles tendon. These exercises may be performed at home or with a therapist. Your caregiver may recommend that you use heel cups of arch supports to help reduce stress on  the plantar fascia. Occasionally, corticosteroid injections are given to reduce inflammation. If symptoms persist for greater than 6 months despite non-surgical (conservative), then surgery may be recommended.   MEDICATION   If pain medication is necessary, then nonsteroidal anti-inflammatory medications, such as aspirin and ibuprofen, or other minor pain relievers, such as acetaminophen, are often recommended.  Do not take pain medication within 7 days before surgery.  Prescription pain relievers may be given if deemed necessary by your caregiver. Use only as directed and only as much as you need.  Corticosteroid injections may be given by your caregiver. These injections should be reserved for the most serious cases, because they may only be given a certain number of times.  HEAT AND COLD  Cold treatment (icing) relieves pain and reduces inflammation. Cold treatment should be applied for 10 to 15 minutes every 2 to 3 hours for inflammation and pain and immediately after any activity that aggravates your symptoms. Use ice packs or massage the area with a piece of ice (ice massage).  Heat treatment may be used prior to performing the stretching and strengthening activities prescribed by your caregiver, physical therapist, or athletic trainer. Use a heat pack or soak the injury in warm water.  SEEK IMMEDIATE MEDICAL CARE IF:  Treatment seems to offer no benefit, or the condition worsens.  Any medications produce adverse side effects.  EXERCISES- RANGE OF MOTION (ROM) AND STRETCHING EXERCISES - Plantar Fasciitis (Heel Spur Syndrome) These exercises may help you when beginning to rehabilitate your injury. Your symptoms may resolve with or without further involvement from your physician, physical therapist or athletic trainer. While completing these exercises, remember:   Restoring tissue flexibility helps normal motion to return to the joints. This allows healthier, less painful movement and  activity.  An effective stretch should be held for at least 30 seconds.  A stretch should never be painful. You should only feel a gentle lengthening or release in the stretched tissue.  RANGE OF MOTION - Toe Extension, Flexion  Sit with your right / left leg crossed over your opposite knee.  Grasp your toes and gently pull them back toward the top of your foot. You should feel a stretch on the bottom of your toes and/or foot.  Hold this stretch for 10 seconds.  Now, gently pull your toes toward the bottom of your foot. You should feel a stretch on the top of your toes and or foot.  Hold this stretch for 10 seconds. Repeat  times. Complete this stretch 3 times per day.   RANGE OF MOTION - Ankle Dorsiflexion, Active Assisted  Remove shoes and sit on a chair that is preferably not on a carpeted surface.  Place right / left foot under knee. Extend your opposite leg for support.  Keeping your heel down, slide your right / left foot back toward the chair until you feel a stretch at your ankle or calf. If you do not feel a stretch, slide your bottom forward to the edge of the chair, while still keeping your heel down.  Hold this stretch   for 10 seconds. Repeat 3 times. Complete this stretch 2 times per day.   STRETCH  Gastroc, Standing  Place hands on wall.  Extend right / left leg, keeping the front knee somewhat bent.  Slightly point your toes inward on your back foot.  Keeping your right / left heel on the floor and your knee straight, shift your weight toward the wall, not allowing your back to arch.  You should feel a gentle stretch in the right / left calf. Hold this position for 10 seconds. Repeat 3 times. Complete this stretch 2 times per day.  STRETCH  Soleus, Standing  Place hands on wall.  Extend right / left leg, keeping the other knee somewhat bent.  Slightly point your toes inward on your back foot.  Keep your right / left heel on the floor, bend your back  knee, and slightly shift your weight over the back leg so that you feel a gentle stretch deep in your back calf.  Hold this position for 10 seconds. Repeat 3 times. Complete this stretch 2 times per day.  STRETCH  Gastrocsoleus, Standing  Note: This exercise can place a lot of stress on your foot and ankle. Please complete this exercise only if specifically instructed by your caregiver.   Place the ball of your right / left foot on a step, keeping your other foot firmly on the same step.  Hold on to the wall or a rail for balance.  Slowly lift your other foot, allowing your body weight to press your heel down over the edge of the step.  You should feel a stretch in your right / left calf.  Hold this position for 10 seconds.  Repeat this exercise with a slight bend in your right / left knee. Repeat 3 times. Complete this stretch 2 times per day.   STRENGTHENING EXERCISES - Plantar Fasciitis (Heel Spur Syndrome)  These exercises may help you when beginning to rehabilitate your injury. They may resolve your symptoms with or without further involvement from your physician, physical therapist or athletic trainer. While completing these exercises, remember:   Muscles can gain both the endurance and the strength needed for everyday activities through controlled exercises.  Complete these exercises as instructed by your physician, physical therapist or athletic trainer. Progress the resistance and repetitions only as guided.  STRENGTH - Towel Curls  Sit in a chair positioned on a non-carpeted surface.  Place your foot on a towel, keeping your heel on the floor.  Pull the towel toward your heel by only curling your toes. Keep your heel on the floor. Repeat 3 times. Complete this exercise 2 times per day.  STRENGTH - Ankle Inversion  Secure one end of a rubber exercise band/tubing to a fixed object (table, pole). Loop the other end around your foot just before your toes.  Place your  fists between your knees. This will focus your strengthening at your ankle.  Slowly, pull your big toe up and in, making sure the band/tubing is positioned to resist the entire motion.  Hold this position for 10 seconds.  Have your muscles resist the band/tubing as it slowly pulls your foot back to the starting position. Repeat 3 times. Complete this exercises 2 times per day.  Document Released: 06/29/2005 Document Revised: 09/21/2011 Document Reviewed: 10/11/2008 [employer] Patient Information 2014 [employer]. 

## 2019-05-24 NOTE — Progress Notes (Signed)
Subjective:  Patient ID: Jorge Francis, male    DOB: [birth year],  MRN: [id number]  Chief Complaint  Patient presents with  . Foot Problem    pt is here with bottom of heel pain, pain has been going on for a couple of months, pt also states that he has a possible ingrown toenail, on the medial to lateral side of the right big toenail    38 y.o. male presents with the above complaint.  Patient states that he has bilateral heel pain.  Is worse when he is waking up in the morning and taken the first day.  The pain does resolve as he has been walking for a while.  However the pain comes back when his been resting and gets up again.  He denies doing anything for the pain.  He has not tried any other treatment options.  He denies any stretching exercises.  He also has a secondary complaint of right lateral border ingrown.  That has started with time and is hurting when he is exercising.  He would like to have that removed.  He denies any other acute complaints.   Review of Systems: Negative except as noted in the HPI. Denies N/V/F/Ch.  Past Medical History:  Diagnosis Date  . ADD (attention deficit disorder)    [facility]  . Anxiety    MDD  . Insomnia     Current Outpatient Medications:  .  fluticasone (FLONASE) 50 MCG/ACT nasal spray, Place 2 sprays into both nostrils daily as needed. , Disp: , Rfl:  .  loratadine (CLARITIN) 10 MG tablet, Take 10 mg by mouth daily., Disp: , Rfl:  .  Multiple Vitamin (MULTIVITAMIN) tablet, Take 1 tablet by mouth daily., Disp: , Rfl:   Social History   Tobacco Use  Smoking Status Former Smoker  . Packs/day: 0.50  . Years: 15.00  . Pack years: 7.50  . Types: Cigarettes  . Quit date: 09/11/2018  . Years since quitting: 0.6  Smokeless Tobacco Never Used    Allergies  Allergen Reactions  . Penicillins     Tongue swells  . Buspar [Buspirone] Other (See Comments)    Ineffective at low dose, sedation at higher dose   Objective:   There were no vitals filed for this visit. There is no height or weight on file to calculate BMI. Constitutional Well developed. Well nourished.  Vascular Dorsalis pedis pulses palpable bilaterally. Posterior tibial pulses palpable bilaterally. Capillary refill normal to all digits.  No cyanosis or clubbing noted. Pedal hair growth normal.  Neurologic Normal speech. Oriented to person, place, and time. Epicritic sensation to light touch grossly present bilaterally.  Dermatologic Nails well groomed and normal in appearance. No open wounds. No skin lesions. Painful ingrowing nail at lateral nail borders of the hallux nail right.  Orthopedic: Normal joint ROM without pain or crepitus bilaterally. No visible deformities. Tender to palpation at the calcaneal tuber bilaterally. No pain with calcaneal squeeze bilaterally. Ankle ROM diminished range of motion bilaterally. Silfverskiold Test: positive bilaterally.   Radiographs: Taken and reviewed. No acute fractures or dislocations. No evidence of stress fracture.  Plantar heel spur absent. Posterior heel spur absent.   Assessment:   1. Foot pain, bilateral   2. Plantar fasciitis of right foot   3. Plantar fasciitis of left foot   4. Ingrown toenail of right foot   5. Pain in right foot    Plan:  Patient was evaluated and treated and all questions answered.  Plantar Fasciitis, bilaterally - XR reviewed as above.  - Educated on icing and stretching. Instructions given.  - Injection delivered to the plantar fascia as below. - DME: Plantar Fascial Brace x 2 - Pharmacologic management: . Educated on risks/benefits and proper taking of medication.  Procedure: Injection Tendon/Ligament Location: Bilateral plantar fascia at the glabrous junction; medial approach. Skin Prep: alcohol Injectate: 0.5 cc 0.5% marcaine plain, 0.5 cc of 1% Lidocaine, 0.5 cc kenalog 10. Disposition: Patient tolerated procedure well. Injection site dressed  with a band-aid.   Ingrown Nail, bilaterally -Patient elects to proceed with minor surgery to remove ingrown toenail removal today. Consent reviewed and signed by patient. -Ingrown nail excised. See procedure note. -Educated on post-procedure care including soaking. Written instructions provided and reviewed. -Patient to follow up in 4 weeks for nail check. During his next visit I will remove the other side of the ingrown on the right side medial border.    Procedure: Excision of Ingrown Toenail Location: Bilateral 1st toe lateral nail borders. Anesthesia: Lidocaine 1% plain; 1.5 mL and Marcaine 0.5% plain; 1.5 mL, digital block. Skin Prep: Betadine. Dressing: Silvadene; telfa; dry, sterile, compression dressing. Technique: Following skin prep, the toe was exsanguinated and a tourniquet was secured at the base of the toe. The affected nail border was freed, split with a nail splitter, and excised. Chemical matrixectomy was then performed with phenol and irrigated out with alcohol. The tourniquet was then removed and sterile dressing applied. Disposition: Patient tolerated procedure well. Patient to return in 2 weeks for follow-up.   No follow-ups on file.  No follow-ups on file.

## 2019-06-21 ENCOUNTER — Encounter: Payer: Self-pay | Admitting: Podiatry

## 2019-06-21 ENCOUNTER — Other Ambulatory Visit: Payer: Self-pay

## 2019-06-21 ENCOUNTER — Ambulatory Visit: Payer: BC Managed Care – PPO | Admitting: Podiatry

## 2019-06-21 DIAGNOSIS — M79672 Pain in left foot: Secondary | ICD-10-CM | POA: Diagnosis not present

## 2019-06-21 DIAGNOSIS — M722 Plantar fascial fibromatosis: Secondary | ICD-10-CM

## 2019-06-21 DIAGNOSIS — M79671 Pain in right foot: Secondary | ICD-10-CM | POA: Diagnosis not present

## 2019-06-21 MED ORDER — METHYLPREDNISOLONE 4 MG PO TBPK: ORAL_TABLET | ORAL | 0 refills | Status: DC

## 2019-06-21 NOTE — Progress Notes (Signed)
Subjective:  Patient ID: Jorge Francis, male    DOB: September 16, 1980,  MRN: [id number]  Chief Complaint  Patient presents with  . Foot Pain    pt is here for a f/u appt of plantar fasciitis    38 y.o. male presents with the above complaint.  Patient is here in follow-up for bilateral plantar fasciitis pain.  Patient is also here for follow-up on an ingrown toenail.  He states ingrown toenail is doing much better.  He has no complaints.  He states that his bilateral heel pain is still that it had helped a little bit but not completely resolved.  He states the injection helped temporarily.  He states it has been wearing the braces which also helped a little bit but not completely.  He denies any other acute treatment options.  He has been doing stretching exercises.  I educated him on doing stretching exercises before he gets out of bed or from any stationary position.   Review of Systems: Negative except as noted in the HPI. Denies N/V/F/Ch.  Past Medical History:  Diagnosis Date  . ADD (attention deficit disorder)    [facility]  . Anxiety    MDD  . Insomnia     Current Outpatient Medications:  .  fluticasone (FLONASE) 50 MCG/ACT nasal spray, Place 2 sprays into both nostrils daily as needed. , Disp: , Rfl:  .  loratadine (CLARITIN) 10 MG tablet, Take 10 mg by mouth daily., Disp: , Rfl:  .  methylPREDNISolone (MEDROL DOSEPAK) 4 MG TBPK tablet, Use as directed, Disp: 1 each, Rfl: 0 .  Multiple Vitamin (MULTIVITAMIN) tablet, Take 1 tablet by mouth daily., Disp: , Rfl:   Social History   Tobacco Use  Smoking Status Former Smoker  . Packs/day: 0.50  . Years: 15.00  . Pack years: 7.50  . Types: Cigarettes  . Quit date: 09/11/2018  . Years since quitting: 0.7  Smokeless Tobacco Never Used    Allergies  Allergen Reactions  . Penicillins     Tongue swells  . Buspar [Buspirone] Other (See Comments)    Ineffective at low dose, sedation at higher dose   Objective:   There were no vitals filed for this visit. There is no height or weight on file to calculate BMI. Constitutional Well developed. Well nourished.  Vascular Dorsalis pedis pulses palpable bilaterally. Posterior tibial pulses palpable bilaterally. Capillary refill normal to all digits.  No cyanosis or clubbing noted. Pedal hair growth normal.  Neurologic Normal speech. Oriented to person, place, and time. Epicritic sensation to light touch grossly present bilaterally.  Dermatologic Nails well groomed and normal in appearance. No open wounds. No skin lesions. Painful ingrowing nail at lateral nail borders of the hallux nail right.  Orthopedic: Normal joint ROM without pain or crepitus bilaterally. No visible deformities. Tender to palpation at the calcaneal tuber bilaterally. No pain with calcaneal squeeze bilaterally. Ankle ROM diminished range of motion bilaterally. Silfverskiold Test: positive bilaterally.   Radiographs: Taken and reviewed. No acute fractures or dislocations. No evidence of stress fracture.  Plantar heel spur absent. Posterior heel spur absent.   Assessment:   No diagnosis found. Plan:  Patient was evaluated and treated and all questions answered.  Plantar Fasciitis, bilaterally - XR reviewed as above.  -Re-educated on icing and stretching. Instructions given.  - Injection delivered to the plantar fascia as below. - DME: None - Pharmacologic management: Medrol Dosepak. Educated on risks/benefits and proper taking of medication. -I spoke to him  about orthotics to help address the plantar fasciitis to help.  Healed the pain.  Patient would like to try over-the-counter orthotics for now.  I spoke to him about obtaining power steps since they really help.  He states he will get them right away. -I will see the patient back again in 1 week and see how the injection as well as power steps Medrol Dosepak to help.  Procedure: Injection Tendon/Ligament x2 Location:  Bilateral plantar fascia at the glabrous junction; medial approach. Skin Prep: alcohol Injectate: 0.5 cc 0.5% marcaine plain, 0.5 cc of 1% Lidocaine, 0.5 cc kenalog 10. Disposition: Patient tolerated procedure well. Injection site dressed with a band-aid.   Ingrown Nail, bilaterally -Resolved   No follow-ups on file.  No follow-ups on file.

## 2019-07-17 ENCOUNTER — Other Ambulatory Visit: Payer: Self-pay

## 2019-07-17 ENCOUNTER — Ambulatory Visit (INDEPENDENT_AMBULATORY_CARE_PROVIDER_SITE_OTHER): Payer: BC Managed Care – PPO | Admitting: Podiatry

## 2019-07-17 DIAGNOSIS — M722 Plantar fascial fibromatosis: Secondary | ICD-10-CM

## 2019-07-17 MED ORDER — MELOXICAM 15 MG PO TABS: 15.0000 mg | ORAL_TABLET | Freq: Every day | ORAL | 1 refills | Status: DC

## 2019-07-20 NOTE — Progress Notes (Signed)
   Subjective: 39 y.o. male presenting today for follow up evaluation of plantar fasciitis of the bilateral feet. He states he is doing better and improving. He reports significant relief after receiving the injections at his last visit. He has also been using the plantar fascial braces and doing the stretching exercises as directed. There are no worsening factors noted at this time. Patient is here for further evaluation and treatment.   Past Medical History:  Diagnosis Date  . ADD (attention deficit disorder)    [facility]  . Anxiety    MDD  . Insomnia      Objective: Physical Exam General: The patient is alert and oriented x3 in no acute distress.  Dermatology: Skin is warm, dry and supple bilateral lower extremities. Negative for open lesions or macerations bilateral.   Vascular: Dorsalis Pedis and Posterior Tibial pulses palpable bilateral.  Capillary fill time is immediate to all digits.  Neurological: Epicritic and protective threshold intact bilateral.   Musculoskeletal: Tenderness to palpation to the plantar aspect of the bilateral heels along the plantar fascia. All other joints range of motion within normal limits bilateral. Strength 5/5 in all groups bilateral.    Assessment: 1. plantar fasciitis bilateral feet  Plan of Care:  1. Patient evaluated.   2. Injection of 0.5cc Celestone soluspan injected into the bilateral heels.  3. Continue using plantar fascial braces.  4. Rx for Meloxicam ordered for patient. 5. Continue using OTC insoles.  6. Return to clinic as needed.     Felecia Shelling, DPM [facility]  Dr. Felecia Shelling, DPM    [address] [place], [place] [address]                Office [phone]  Fax [phone]

## 2019-10-09 ENCOUNTER — Ambulatory Visit: Payer: BC Managed Care – PPO | Admitting: Family Medicine

## 2019-10-09 ENCOUNTER — Other Ambulatory Visit: Payer: Self-pay

## 2019-10-09 ENCOUNTER — Encounter: Payer: Self-pay | Admitting: Family Medicine

## 2019-10-09 DIAGNOSIS — F411 Generalized anxiety disorder: Secondary | ICD-10-CM

## 2019-10-09 DIAGNOSIS — M722 Plantar fascial fibromatosis: Secondary | ICD-10-CM

## 2019-10-09 DIAGNOSIS — M549 Dorsalgia, unspecified: Secondary | ICD-10-CM | POA: Diagnosis not present

## 2019-10-09 MED ORDER — BACLOFEN 10 MG PO TABS: 10.0000 mg | ORAL_TABLET | Freq: Every evening | ORAL | 1 refills | Status: DC | PRN

## 2019-10-09 MED ORDER — CITALOPRAM HYDROBROMIDE 20 MG PO TABS: 20.0000 mg | ORAL_TABLET | Freq: Every day | ORAL | 3 refills | Status: DC

## 2019-10-09 NOTE — Patient Instructions (Signed)
Try ice and stretching and baclofen at night.   Start citalopram and let me know if that isn't helping. I'd like an update in about 2 weeks, even if that hasn't had time to work.  Take care.  Glad to see you.

## 2019-10-09 NOTE — Progress Notes (Signed)
This visit occurred during the SARS-CoV-2 public health emergency.  Safety protocols were in place, including screening questions prior to the visit, additional usage of staff PPE, and extensive cleaning of exam room while observing appropriate contact time as indicated for disinfecting solutions.  Anxiety.  Worse recently.  Worse at the end of the day.  He can get tense in spite of meditation and prayer. His daughter was recently sick (covid neg) with a fever, but is better in the meantime.  He is still in [place].  He is maintaining his sobriety.  He felt worse on effexor.  He is in therapy.  No SI/HI.  Still with heel pain with injection prev done with some relief.  He is better than prev but still with pain.  He is stretching prior to getting out of bed.  Using ibuprofen some.    Back pain. Had been to chiropractor. Recurrently on and off pain.  With spasms.  More pain today.  Yesterday was a good day.  Was washing his hair and felt pain return.  He got some relief with ice.  Hasn't done HEP yet.   Meds, vitals, and allergies reviewed.   ROS: Per HPI unless specifically indicated in ROS section   GEN: nad, alert and oriented HEENT: ncat NECK: supple w/o LA CV: rrr PULM: ctab, no inc wob ABD: soft, +bs EXT: no edema SKIN: well perfused.   R lower trap and upper back ttp, not in midline.   Neck sore with ROM flex and ext but not stiff.

## 2019-10-11 DIAGNOSIS — M549 Dorsalgia, unspecified: Secondary | ICD-10-CM | POA: Insufficient documentation

## 2019-10-11 NOTE — Assessment & Plan Note (Signed)
I will ask the foot clinic about when he can be seen again in when it would be possible for him to have a repeat injection.  I need podiatry input.

## 2019-10-11 NOTE — Assessment & Plan Note (Signed)
Discussed options.  Reasonable to restart citalopram with routine cautions and he will update me as needed.  No suicidal or homicidal intent.  Onset of affect with medication discussed.  Okay for outpatient follow-up.

## 2019-10-11 NOTE — Assessment & Plan Note (Signed)
No emergent symptoms.  Likely with spasms.  Reasonable to try baclofen with routine cautions and he will update me as needed.  He agrees.

## 2019-10-31 ENCOUNTER — Other Ambulatory Visit: Payer: Self-pay | Admitting: Family Medicine

## 2019-10-31 ENCOUNTER — Encounter: Payer: Self-pay | Admitting: Family Medicine

## 2019-10-31 NOTE — Telephone Encounter (Signed)
Electronic refill request. Baclofen Last office visit:   10/09/2019 Last Filled:    30 each 1 10/09/2019  Please advise.

## 2019-11-01 NOTE — Telephone Encounter (Signed)
Spoke to patient by telephone and was advised that he did not request a refill because he thought that he had one left. Patient stated that his upper back is feeling better, but now he is having some soreness in his lower back. Patient stated that he did not take the medication last night and his back was more sore today. Patient stated that he does not take it every day. Patient stated that he did want to let Dr. Para March know that the anti-depressant is working.

## 2019-11-01 NOTE — Telephone Encounter (Signed)
Noted.  Thanks.  I would continue as is with as needed muscle relaxer use and please update me as needed.  Glad the antidepressant is helping.

## 2019-11-01 NOTE — Telephone Encounter (Signed)
Sent. Thanks.  If patient is not improving with medication then please let me know.

## 2019-11-02 ENCOUNTER — Encounter: Payer: Self-pay | Admitting: *Deleted

## 2019-11-02 NOTE — Telephone Encounter (Signed)
VM full, message sent through MyChart.

## 2019-11-25 ENCOUNTER — Other Ambulatory Visit: Payer: Self-pay | Admitting: Family Medicine

## 2019-11-27 NOTE — Telephone Encounter (Signed)
Sent. Thanks.   

## 2019-11-27 NOTE — Telephone Encounter (Signed)
Last filled 11-01-19 #30 Last OV 10-09-19 No Future OV [facility] [address]

## 2019-12-28 ENCOUNTER — Other Ambulatory Visit: Payer: Self-pay | Admitting: Family Medicine

## 2019-12-28 NOTE — Telephone Encounter (Signed)
Electronic refill request. Baclofen Last office visit:   10/09/2019 Last Filled:     30 tablet 1 11/27/2019  Please advise.

## 2019-12-29 NOTE — Telephone Encounter (Signed)
Sent. Thanks.   

## 2020-01-11 ENCOUNTER — Other Ambulatory Visit: Payer: Self-pay | Admitting: Family Medicine

## 2020-01-11 DIAGNOSIS — Z77011 Contact with and (suspected) exposure to lead: Secondary | ICD-10-CM

## 2020-01-11 NOTE — Telephone Encounter (Signed)
Electronic refill request. Baclofen Last office visit:   10/09/2019 Last Filled:    30 tablet 1 12/29/2019  Please advise.

## 2020-01-12 NOTE — Telephone Encounter (Signed)
I think it makes sense for him to get a lead level drawn.  I put in the order.  He can get this drawn here or at the [place] clinic.

## 2020-01-12 NOTE — Telephone Encounter (Signed)
This was an auto refill and patient says he has plenty of medication and has asked the pharmacy to please stop requesting refills.  Patient says his daughter who is 39 year old has testing postive for Lead at an 8.  His other daughter has been tested but the results are not in yet.  Patient asks if he should be tested because he does work around Lead.  They do not live in an older home, built in 1996.

## 2020-01-12 NOTE — Addendum Note (Signed)
Addended by: Joaquim Nam on: 01/12/2020 04:23 PM   Modules accepted: Orders

## 2020-01-12 NOTE — Telephone Encounter (Signed)
Left detailed message on voicemail.  

## 2020-01-12 NOTE — Telephone Encounter (Signed)
Sent. Thanks.  Please check with patient to see if this was an auto refill given the timeline.

## 2020-02-13 ENCOUNTER — Other Ambulatory Visit: Payer: Self-pay

## 2020-02-13 ENCOUNTER — Other Ambulatory Visit: Payer: BC Managed Care – PPO

## 2020-02-13 DIAGNOSIS — Z20822 Contact with and (suspected) exposure to covid-19: Secondary | ICD-10-CM | POA: Diagnosis not present

## 2020-02-13 DIAGNOSIS — J069 Acute upper respiratory infection, unspecified: Secondary | ICD-10-CM | POA: Diagnosis not present

## 2020-02-14 ENCOUNTER — Telehealth: Payer: BC Managed Care – PPO | Admitting: Primary Care

## 2020-02-14 LAB — NOVEL CORONAVIRUS, NAA: SARS-CoV-2, NAA: NOT DETECTED

## 2020-02-14 LAB — SARS-COV-2, NAA 2 DAY TAT

## 2020-03-22 DIAGNOSIS — I809 Phlebitis and thrombophlebitis of unspecified site: Secondary | ICD-10-CM | POA: Diagnosis not present

## 2020-03-25 ENCOUNTER — Encounter: Payer: Self-pay | Admitting: Family Medicine

## 2020-03-25 ENCOUNTER — Ambulatory Visit: Payer: BC Managed Care – PPO | Admitting: Family Medicine

## 2020-03-25 ENCOUNTER — Other Ambulatory Visit: Payer: Self-pay

## 2020-03-25 DIAGNOSIS — F411 Generalized anxiety disorder: Secondary | ICD-10-CM | POA: Diagnosis not present

## 2020-03-25 DIAGNOSIS — Z77011 Contact with and (suspected) exposure to lead: Secondary | ICD-10-CM

## 2020-03-25 DIAGNOSIS — M79603 Pain in arm, unspecified: Secondary | ICD-10-CM

## 2020-03-25 MED ORDER — CITALOPRAM HYDROBROMIDE 20 MG PO TABS: 30.0000 mg | ORAL_TABLET | Freq: Every day | ORAL | 1 refills | Status: DC

## 2020-03-25 NOTE — Progress Notes (Signed)
This visit occurred during the SARS-CoV-2 public health emergency.  Safety protocols were in place, including screening questions prior to the visit, additional usage of staff PPE, and extensive cleaning of exam room while observing appropriate contact time as indicated for disinfecting solutions.  He is at home with his wife and kids.  D/w pt.  They are doing better overall.  He is still in [place].  Citalopram prev helped.  Still in therapy.  More anxiety recently.  D/w pt about options, ie inc citalopram to 30mg .    Due for lead screen, d/w pt.  He could have led exposure through work.  D/w pt about tapering afrin, using only 1 spray per nostril at night.    He gave blood about 1 month ago.  R medial antecubital area.  Then had a raised area on the forearm x2 in the last few days.  The lumps on medial forearm resolved.  Still having pain along the radial side of R forearm, present since giving blood.  No FCNAV.  Clearly better today.    Meds, vitals, and allergies reviewed.   ROS: Per HPI unless specifically indicated in ROS section   nad ncat Neck supple, no LA Rrr, no murmur ctab Skin without nodularity on the forearms.  No erythema or bruising.  Distally neurovascular intact with normal grip bilaterally.  No masses noted on the arms.

## 2020-03-25 NOTE — Patient Instructions (Addendum)
Likely irritation and inflammation but not infection.  Ibuprofen with food is reasonable.  Update me as needed.  Increase citalopram to 30mg .   Take care.  Glad to see you. Go to the lab on the way out.   If you have mychart we'll likely use that to update you.

## 2020-03-27 DIAGNOSIS — Z77011 Contact with and (suspected) exposure to lead: Secondary | ICD-10-CM | POA: Insufficient documentation

## 2020-03-27 DIAGNOSIS — M25539 Pain in unspecified wrist: Secondary | ICD-10-CM | POA: Insufficient documentation

## 2020-03-27 DIAGNOSIS — M79603 Pain in arm, unspecified: Secondary | ICD-10-CM | POA: Insufficient documentation

## 2020-03-27 LAB — LEAD, BLOOD (ADULT >= 16 YRS): Lead: 3 ug/dL (ref <5)

## 2020-03-27 NOTE — Assessment & Plan Note (Signed)
No alarming findings on exam at this point. Likely irritation and inflammation but not infection.  He could have irritation on peripheral nerve still giving him some symptoms. Ibuprofen with food is reasonable.  Update me as needed.  I expect him to gradually improve.

## 2020-03-27 NOTE — Assessment & Plan Note (Signed)
Discussed options.  Continue AA and counseling. Increase citalopram to 30mg .  He will update me as needed.  Still okay for outpatient follow-up.

## 2020-03-27 NOTE — Assessment & Plan Note (Signed)
See notes on labs. 

## 2020-05-23 DIAGNOSIS — J069 Acute upper respiratory infection, unspecified: Secondary | ICD-10-CM | POA: Diagnosis not present

## 2020-07-14 ENCOUNTER — Other Ambulatory Visit: Payer: Self-pay

## 2020-07-14 ENCOUNTER — Encounter (HOSPITAL_COMMUNITY): Payer: Self-pay | Admitting: Emergency Medicine

## 2020-07-14 ENCOUNTER — Ambulatory Visit (HOSPITAL_COMMUNITY)
Admission: EM | Admit: 2020-07-14 | Discharge: 2020-07-14 | Disposition: A | Discharge status: Home / Self Care | Payer: BC Managed Care – PPO | Attending: Internal Medicine | Admitting: Internal Medicine

## 2020-07-14 ENCOUNTER — Ambulatory Visit (INDEPENDENT_AMBULATORY_CARE_PROVIDER_SITE_OTHER): Payer: BC Managed Care – PPO

## 2020-07-14 DIAGNOSIS — S6992XA Unspecified injury of left wrist, hand and finger(s), initial encounter: Secondary | ICD-10-CM | POA: Diagnosis not present

## 2020-07-14 DIAGNOSIS — S61211A Laceration without foreign body of left index finger without damage to nail, initial encounter: Secondary | ICD-10-CM | POA: Diagnosis not present

## 2020-07-14 DIAGNOSIS — M7989 Other specified soft tissue disorders: Secondary | ICD-10-CM | POA: Diagnosis not present

## 2020-07-14 MED ORDER — DOXYCYCLINE HYCLATE 100 MG PO CAPS: 100.0000 mg | ORAL_CAPSULE | Freq: Two times a day (BID) | ORAL | 0 refills | Status: AC

## 2020-07-14 MED ORDER — LIDOCAINE HCL (PF) 1 % IJ SOLN
INTRAMUSCULAR | Status: AC
  Filled 2020-07-14: qty 30

## 2020-07-14 NOTE — ED Triage Notes (Signed)
Pt reports he inj his left index finger 2.5 lbs sledge hammer  Sx today include swelling, bleeding  Last tetanus w/in 5 years  A& Ox4... NAD.Marland Kitchen ambulatory
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
## 2020-07-14 NOTE — Discharge Instructions (Addendum)
Leave the dressing in place for next 24 hours.  After 24 hours is complete remove the dressing, wash the wound with warm water and soap, and apply thin smear bacitracin and another dressing.  Do this for 2 days.  After 2 days you can stop applying ointment to the wound.  You can leave the wound open to air while you are at home and covered with a Band-Aid when at work.  Sutures can come out in 10 days.  If you develop any increased pain, swelling, redness at the site, drainage, or red streaks going up your finger you need to go to the ER for evaluation.

## 2020-07-25 ENCOUNTER — Encounter: Payer: Self-pay | Admitting: Family Medicine

## 2020-07-25 ENCOUNTER — Other Ambulatory Visit: Payer: Self-pay

## 2020-07-25 ENCOUNTER — Ambulatory Visit: Payer: BC Managed Care – PPO | Admitting: Family Medicine

## 2020-07-25 VITALS — BP 126/80 | HR 72 | Temp 96.4°F | Ht 70.5 in | Wt 239.1 lb

## 2020-07-25 DIAGNOSIS — F411 Generalized anxiety disorder: Secondary | ICD-10-CM | POA: Diagnosis not present

## 2020-07-25 DIAGNOSIS — Z23 Encounter for immunization: Secondary | ICD-10-CM | POA: Diagnosis not present

## 2020-07-25 DIAGNOSIS — Z4802 Encounter for removal of sutures: Secondary | ICD-10-CM | POA: Diagnosis not present

## 2020-07-25 MED ORDER — CITALOPRAM HYDROBROMIDE 20 MG PO TABS: 30.0000 mg | ORAL_TABLET | Freq: Every day | ORAL | 1 refills | Status: DC

## 2020-07-25 NOTE — Progress Notes (Signed)
This visit occurred during the SARS-CoV-2 public health emergency.  Safety protocols were in place, including screening questions prior to the visit, additional usage of staff PPE, and extensive cleaning of exam room while observing appropriate contact time as indicated for disinfecting solutions.  Hand laceration.  L 2nd digit.  Done 07/14/20.  Due for suture removal.  No fx on xray and normal ROM.    Mood is better with citalopram, no ADE on med.  Still going to meetings and doing well. He needs refill of medication. Done at office visit.  Rare use of baclofen.    Meds, vitals, and allergies reviewed.   ROS: Per HPI unless specifically indicated in ROS section   nad ncat Speech and mood normal, judgment normal. All sutures removed from left second digit and still with good tissue apposition. Normal range of motion of the finger and distally neurovascular intact. Injury site covered with Steri-Strips for extra reinforcement and tolerated well. No spreading redness. No discharge. No signs of infection.

## 2020-07-25 NOTE — Patient Instructions (Signed)
The steristrips should gradually peel off.   Keep the area clean and update me as needed.  Take care.  Glad to see you.

## 2020-07-29 DIAGNOSIS — Z4802 Encounter for removal of sutures: Secondary | ICD-10-CM | POA: Insufficient documentation

## 2020-07-29 NOTE — Assessment & Plan Note (Signed)
Done at office visit. See above. No complication.

## 2020-07-29 NOTE — Assessment & Plan Note (Signed)
Mood is good on citalopram. Would continue as is. I thanked him for his efforts. He will update me as needed.

## 2020-08-06 DIAGNOSIS — Z20822 Contact with and (suspected) exposure to covid-19: Secondary | ICD-10-CM | POA: Diagnosis not present

## 2020-08-06 DIAGNOSIS — Z03818 Encounter for observation for suspected exposure to other biological agents ruled out: Secondary | ICD-10-CM | POA: Diagnosis not present

## 2020-08-09 DIAGNOSIS — Z20822 Contact with and (suspected) exposure to covid-19: Secondary | ICD-10-CM | POA: Diagnosis not present

## 2020-08-09 DIAGNOSIS — Z03818 Encounter for observation for suspected exposure to other biological agents ruled out: Secondary | ICD-10-CM | POA: Diagnosis not present

## 2020-08-12 DIAGNOSIS — Z20822 Contact with and (suspected) exposure to covid-19: Secondary | ICD-10-CM | POA: Diagnosis not present

## 2020-08-12 DIAGNOSIS — Z20828 Contact with and (suspected) exposure to other viral communicable diseases: Secondary | ICD-10-CM | POA: Diagnosis not present

## 2020-08-17 DIAGNOSIS — U071 COVID-19: Secondary | ICD-10-CM | POA: Diagnosis not present

## 2020-08-17 DIAGNOSIS — Z20822 Contact with and (suspected) exposure to covid-19: Secondary | ICD-10-CM | POA: Diagnosis not present

## 2020-12-14 DIAGNOSIS — Z03818 Encounter for observation for suspected exposure to other biological agents ruled out: Secondary | ICD-10-CM | POA: Diagnosis not present

## 2021-01-13 ENCOUNTER — Encounter: Payer: Self-pay | Admitting: Family Medicine

## 2021-01-15 ENCOUNTER — Other Ambulatory Visit: Payer: Self-pay | Admitting: Family Medicine

## 2021-01-15 MED ORDER — BACLOFEN 10 MG PO TABS: 10.0000 mg | ORAL_TABLET | Freq: Every evening | ORAL | 1 refills | Status: DC | PRN

## 2021-02-06 DIAGNOSIS — Z20822 Contact with and (suspected) exposure to covid-19: Secondary | ICD-10-CM | POA: Diagnosis not present

## 2021-02-06 DIAGNOSIS — Z20828 Contact with and (suspected) exposure to other viral communicable diseases: Secondary | ICD-10-CM | POA: Diagnosis not present

## 2021-02-22 ENCOUNTER — Other Ambulatory Visit: Payer: Self-pay | Admitting: Family Medicine

## 2021-07-20 ENCOUNTER — Other Ambulatory Visit: Payer: Self-pay | Admitting: Family Medicine

## 2021-07-21 NOTE — Telephone Encounter (Signed)
Refill request for baclofen 10 mg tablets  LOV - 07/25/20 Next OV - 09/12/21 Last refill - 01/15/21 #90/1

## 2021-08-29 ENCOUNTER — Other Ambulatory Visit: Payer: Self-pay | Admitting: Family Medicine

## 2021-08-29 ENCOUNTER — Encounter: Payer: Self-pay | Admitting: Family Medicine

## 2021-09-05 ENCOUNTER — Other Ambulatory Visit: Payer: Self-pay | Admitting: Family Medicine

## 2021-09-05 DIAGNOSIS — E781 Pure hyperglyceridemia: Secondary | ICD-10-CM

## 2021-09-09 ENCOUNTER — Other Ambulatory Visit (INDEPENDENT_AMBULATORY_CARE_PROVIDER_SITE_OTHER): Payer: BC Managed Care – PPO

## 2021-09-09 ENCOUNTER — Other Ambulatory Visit: Payer: Self-pay

## 2021-09-09 DIAGNOSIS — E781 Pure hyperglyceridemia: Secondary | ICD-10-CM | POA: Diagnosis not present

## 2021-09-09 LAB — COMPREHENSIVE METABOLIC PANEL
ALT: 20 U/L (ref 0–53)
AST: 16 U/L (ref 0–37)
Albumin: 4.6 g/dL (ref 3.5–5.2)
Alkaline Phosphatase: 103 U/L (ref 39–117)
BUN: 13 mg/dL (ref 6–23)
CO2: 25 mEq/L (ref 19–32)
Calcium: 9.6 mg/dL (ref 8.4–10.5)
Chloride: 103 mEq/L (ref 96–112)
Creatinine, Ser: 0.83 mg/dL (ref 0.40–1.50)
GFR: 109.37 mL/min (ref >60.00)
Glucose, Bld: 119 mg/dL — ABNORMAL HIGH (ref 70–99)
Potassium: 3.7 mEq/L (ref 3.5–5.1)
Sodium: 138 mEq/L (ref 135–145)
Total Bilirubin: 0.9 mg/dL (ref 0.2–1.2)
Total Protein: 7.1 g/dL (ref 6.0–8.3)

## 2021-09-09 LAB — LIPID PANEL
Cholesterol: 216 mg/dL — ABNORMAL HIGH (ref 0–200)
HDL: 32.6 mg/dL — ABNORMAL LOW (ref >39.00)
NonHDL: 183.17
Total CHOL/HDL Ratio: 7
Triglycerides: 217 mg/dL — ABNORMAL HIGH (ref 0.0–149.0)
VLDL: 43.4 mg/dL — ABNORMAL HIGH (ref 0.0–40.0)

## 2021-09-09 LAB — LDL CHOLESTEROL, DIRECT: Direct LDL: 150 mg/dL

## 2021-09-12 ENCOUNTER — Other Ambulatory Visit: Payer: Self-pay

## 2021-09-12 ENCOUNTER — Encounter: Payer: Self-pay | Admitting: Family Medicine

## 2021-09-12 ENCOUNTER — Ambulatory Visit (INDEPENDENT_AMBULATORY_CARE_PROVIDER_SITE_OTHER): Payer: BC Managed Care – PPO | Admitting: Family Medicine

## 2021-09-12 VITALS — BP 120/78 | HR 74 | Temp 97.5°F | Ht 70.5 in | Wt 238.0 lb

## 2021-09-12 DIAGNOSIS — Z0000 Encounter for general adult medical examination without abnormal findings: Secondary | ICD-10-CM | POA: Diagnosis not present

## 2021-09-12 DIAGNOSIS — F411 Generalized anxiety disorder: Secondary | ICD-10-CM

## 2021-09-12 DIAGNOSIS — M549 Dorsalgia, unspecified: Secondary | ICD-10-CM

## 2021-09-12 DIAGNOSIS — M25539 Pain in unspecified wrist: Secondary | ICD-10-CM

## 2021-09-12 MED ORDER — CITALOPRAM HYDROBROMIDE 20 MG PO TABS: ORAL_TABLET | ORAL | 1 refills | Status: DC

## 2021-09-12 NOTE — Patient Instructions (Signed)
Keep working on diet and exercise.  ?Update me as needed.  ?Thanks for your effort.  ?You should get a call about PT.  ?Take care.  Glad to see you. ?

## 2021-09-12 NOTE — Progress Notes (Signed)
This visit occurred during the SARS-CoV-2 public health emergency.  Safety protocols were in place, including screening questions prior to the visit, additional usage of staff PPE, and extensive cleaning of exam room while observing appropriate contact time as indicated for disinfecting solutions. ? ?CPE- See plan.  Routine anticipatory guidance given to patient.  See health maintenance.  The possibility exists that previously documented standard health maintenance information may have been brought forward from a previous encounter into this note.  If needed, that same information has been updated to reflect the current situation based on today's encounter.   ? ?Tetanus 2020 ?Flu shot prev done.   ?Shingles and PNA not due.  ?Covid vaccine prev done.   ?Colon and prostate cancer screening not due.   ?Living will d/w pt.  Wife designated if patient incapacitated.   ?Diet and exercise d/w pt along with labs.  labs improved.  [facility] ?Diet and exercise, walking daily.   ? ?He is sober and still in meetings.   ? ?His wife is doing well.   ? ?He is using baclofen prn, not daily.  Back is still sore.  D/w pt about seeing PT.  Just inferior to the L scapula and just above beltline on R lower back.  L handed.  He is lifting at work, does some repetitive work some of the time.  No radicular leg pain.  No weakness.   ? ?L lateral wrist pain.  He feels a click on the L wrist with ROM. Better with changing his watch.  Normal flex and extension but he has a click with ulnar deviation.  No redness or puffiness.  D/w pt about icing and bracing as needed.   ? ?Mood d/w pt.  Mood is good, he had been exercising.  Still on citalopram 30mg  a day.  Rx printed for patient and he is going to check with pharmacy about refills.   ? ?He wasn't fasting for labs so they are reasonable, d/w pt.   ? ?The 10-year ASCVD risk score (Arnett DK, et al., 2019) is: 2.2% ?  Values used to calculate the score: ?    Age: 41  years ?    Sex: Male ?    Is Non-Hispanic African American: No ?    Diabetic: No ?    Tobacco smoker: No ?    Systolic Blood Pressure: 120 mmHg ?    Is BP treated: No ?    HDL Cholesterol: 32.6 mg/dL ?    Total Cholesterol: 216 mg/dL ? ?PMH and SH reviewed ? ?Meds, vitals, and allergies reviewed.  ? ?ROS: Per HPI.  Unless specifically indicated otherwise in HPI, the patient denies: ? ?General: fever. ?Eyes: acute vision changes ?ENT: sore throat ?Cardiovascular: chest pain ?Respiratory: SOB ?GI: vomiting ?GU: dysuria ?Musculoskeletal: acute back pain ?Derm: acute rash ?Neuro: acute motor dysfunction ?Psych: worsening mood ?Endocrine: polydipsia ?Heme: bleeding ?Allergy: hayfever ? ?GEN: nad, alert and oriented ?HEENT: ncat ?NECK: supple w/o LA ?CV: rrr. ?PULM: ctab, no inc wob ?ABD: soft, +bs ?EXT: no edema ?SKIN: no acute rash ?Back nontender to palpation midline.  Slightly tender/tight  inferior to the L scapula and just above beltline on R lower backwrist with normal inspection bilaterally ?Normal flex and extension but he has a click with ulnar deviation at the left wrist.  No redness or puffiness.  Distally neurovascular intact. ?

## 2021-09-14 NOTE — Assessment & Plan Note (Signed)
Mood is good, he had been exercising.  Still on citalopram 30mg  a day.  Rx printed for patient and he is going to check with pharmacy about refills.   Continue citalopram as is. ?

## 2021-09-14 NOTE — Assessment & Plan Note (Signed)
Use baclofen as needed.  Refer to PT.  He agrees to plan. ?

## 2021-09-14 NOTE — Assessment & Plan Note (Signed)
?  Tetanus 2020 ?Flu shot prev done.   ?Shingles and PNA not due.  ?Covid vaccine prev done.   ?Colon and prostate cancer screening not due.   ?Living will d/w pt.  Wife designated if patient incapacitated.   ?Diet and exercise d/w pt along with labs.  labs improved.  ?[facility] ?Diet and exercise, walking daily.   ? ?He is sober and still in meetings.   ? ?His wife is doing well.   ?

## 2021-09-14 NOTE — Assessment & Plan Note (Signed)
D/w pt about icing and bracing as needed.  He agrees with plan.  He can update me as needed. ?

## 2021-09-17 ENCOUNTER — Ambulatory Visit: Payer: BC Managed Care – PPO

## 2021-12-15 ENCOUNTER — Encounter: Payer: Self-pay | Admitting: Family Medicine

## 2021-12-15 ENCOUNTER — Ambulatory Visit: Payer: BC Managed Care – PPO | Admitting: Family Medicine

## 2021-12-15 VITALS — BP 120/82 | HR 72 | Temp 97.7°F | Ht 70.5 in | Wt 238.0 lb

## 2021-12-15 DIAGNOSIS — S63592A Other specified sprain of left wrist, initial encounter: Secondary | ICD-10-CM | POA: Diagnosis not present

## 2021-12-15 MED ORDER — METHYLPREDNISOLONE ACETATE 40 MG/ML IJ SUSP
20.0000 mg | Freq: Once | INTRAMUSCULAR | Status: AC
  Administered 2021-12-15: 20 mg via INTRA_ARTICULAR

## 2021-12-15 MED ORDER — DICLOFENAC SODIUM 75 MG PO TBEC: 75.0000 mg | DELAYED_RELEASE_TABLET | Freq: Two times a day (BID) | ORAL | 1 refills | Status: DC

## 2021-12-15 NOTE — Addendum Note (Signed)
Addended by: Damita Lack on: 12/15/2021 09:39 AM   Modules accepted: Orders

## 2021-12-15 NOTE — Progress Notes (Addendum)
Jorge Francis T. Vyla Pint, MD, CAQ Sports Medicine [facility] [address] [place], [address]  Phone: [phone]  FAX: [phone]  Jorge Francis - 41 y.o. male  MRN [id number]  Date of Birth: 11/14/1980  Date: 12/15/2021  PCP: Jorge Nam, MD  Referral: Jorge Nam, MD  Chief Complaint  Patient presents with   Acute Visit    Ongoing wrist pain that has not gotten better. Patient has tried wearing a brace and ice but ibuprofen is the only thing that helps.    Subjective:   Jorge Francis is a 41 y.o. very pleasant male patient with Body mass index is 33.67 kg/m. who presents with the following:  Ongoing wrist pain: Dr. Para March asked me face-to-face to evaluate the patient today, in office verbal consultation was made today.  This very pleasant gentleman, right-hand-dominant presents after a fall hit the medial aspect of his wrist almost 6 months ago.  He did have some bruising and swelling that only lasted for a few days, now he is having pain with a mechanical click on the ulnar aspect.  He is able to be fairly functional, but at nighttime it does hurt and he does click and pop.  Sometimes it will be painful enough to wake him up at night.  He has tried some ibuprofen as well as a cock-up wrist splint.  He also tried to ice his wrist, but none of these have provided some long-lasting relief.  Review of Systems is noted in the HPI, as appropriate  Objective:   BP 120/82 (BP Location: Left Arm, Patient Position: Sitting, Cuff Size: Normal)   Pulse 72   Temp 97.7 F (36.5 C) (Temporal)   Ht 5' 10.5" (1.791 m)   Wt 238 lb (108 kg)   SpO2 97%   BMI 33.67 kg/m   GEN: No acute distress; alert,appropriate. PULM: Breathing comfortably in no respiratory distress PSYCH: Normally interactive.  EXTR: No clubbing/cyanosis/edema Normal gait  Hand: L Ecchymosis or edema: neg ROM wrist/hand/digits/elbow: full  He does have pain with  ulnar deviation He has tenderness directly at the TFCC Palpable mechanical click with motion. Carpals, MCP's, digits: NT Distal Ulna and Radius: NT Supination lift test: neg Ecchymosis or edema: neg Cysts/nodules: neg Finkelstein's test: neg Snuffbox tenderness: neg Scaphoid tubercle: NT Hook of Hamate: NT Resisted supination: NT Full composite fist Grip, all digits: 5/5 str No tenosynovitis Axial load test: some pai Atrophy: neg  Hand sensation: intact   Laboratory and Imaging Data:  Assessment and Plan:     ICD-10-CM   1. TFCC (triangular fibrocartilage complex) tear, left, initial encounter  Z56.387F      Intraoffice consult, evaluated today at the time of face-to-face consult with primary care doctor.  History and exam consistent with left-sided TFCC tear.  I reviewed the natural history of these injuries, mechanical clicking is often felt, and this will likely occur to some degree indefinitely.  The main goal here would be to have him become minimally to completely pain-free.  I am going to add some diclofenac, add diclofenac gel, and we are going to do a TFCC injection today.  Aspiration/Injection Procedure Note Jorge Francis 1981-03-14 Date of procedure: 12/15/2021  Procedure: Small Joint Aspiration / Injection of Wrist, TFCC, L Indications: Pain  Procedure Details Verbal consent was obtained. Risks (including atrophy, and skin lightening), benefits, and alternatives were discussed. Prepped with Chloraprep and Ethyl Chloride used for anesthesia. Under sterile conditions,  the  patient was injected just distal to the ulnar styloid with palm facing down. Aspiration showed no blood. Medication flowed freely without resistance.  Needle size: 22 gauge 1 1/2 inch Injection: 1/2 cc of Lidocaine 1% and 1/2 cc of Depo-Medrol 40 mg    Medication Management during today's office visit: Meds ordered this encounter  Medications   diclofenac (VOLTAREN) 75 MG EC tablet     Sig: Take 1 tablet (75 mg total) by mouth 2 (two) times daily.    Dispense:  60 tablet    Refill:  1   There are no discontinued medications.  Orders placed today for conditions managed today: No orders of the defined types were placed in this encounter.   Follow-up if needed: He can follow-up if needed.  I can always repeat the injection.  If he does quite poorly with persistent symptoms that are significantly impairing, we can always get an MR arthrogram of the patient's wrist.  Dragon [employer] One speech-to-text software was used for transcription in this dictation.  Possible transcriptional errors can occur using Animal nutritionist.   Signed,  Elpidio Galea. Jorge Bui, MD   Outpatient Encounter Medications as of 12/15/2021  Medication Sig   baclofen (LIORESAL) 10 MG tablet TAKE 1 TABLET (10 MG TOTAL) BY MOUTH AT BEDTIME AS NEEDED FOR MUSCLE SPASMS (SEDATION CAUTION).   citalopram (CELEXA) 20 MG tablet TAKE 1 AND 1/2 TABLETS DAILY BY MOUTH   diclofenac (VOLTAREN) 75 MG EC tablet Take 1 tablet (75 mg total) by mouth 2 (two) times daily.   ibuprofen (ADVIL) 200 MG tablet Take 200 mg by mouth every 6 (six) hours as needed.   loratadine (CLARITIN) 10 MG tablet Take 10 mg by mouth daily.   No facility-administered encounter medications on file as of 12/15/2021.

## 2021-12-15 NOTE — Patient Instructions (Signed)
Voltaren 1% gel, over the counter ?You can apply up to 4 times a day ? ?This can be applied to any joint: knee, wrist, fingers, elbows, shoulders, feet and ankles. ?Can apply to any tendon: tennis elbow, achilles, tendon, rotator cuff or any other tendon. ? ?Minimal is absorbed in the bloodstream: ok with oral anti-inflammatory or a blood thinner. ? ?Cost is about 9 dollars  ?

## 2021-12-15 NOTE — Progress Notes (Signed)
Wrist pain.  Prev noted.  Noted mostly at night, some in the day.  Ulnar side of wrist, distal ulna.  Tried ice, brace, ibuprofen.  Ibuprofen helped some but he got the point of having GI upset.  No bruising. No redness.  No repetitive motion.    I asked patient about seeing Dr. Patsy Lager and patient was added to his schedule, patient agreed.   No charge for my portion of the visit.  Jorge Francis ===========================

## 2022-05-18 ENCOUNTER — Encounter: Payer: Self-pay | Admitting: Family Medicine

## 2022-05-18 ENCOUNTER — Ambulatory Visit (INDEPENDENT_AMBULATORY_CARE_PROVIDER_SITE_OTHER): Payer: BC Managed Care – PPO | Admitting: Family Medicine

## 2022-05-18 VITALS — BP 118/88 | HR 78 | Temp 97.9°F | Ht 70.5 in | Wt 251.0 lb

## 2022-05-18 DIAGNOSIS — M25532 Pain in left wrist: Secondary | ICD-10-CM

## 2022-05-18 DIAGNOSIS — S63592A Other specified sprain of left wrist, initial encounter: Secondary | ICD-10-CM

## 2022-05-18 MED ORDER — TRIAMCINOLONE ACETONIDE 40 MG/ML IJ SUSP
20.0000 mg | Freq: Once | INTRAMUSCULAR | Status: AC
  Administered 2022-05-18: 20 mg via INTRA_ARTICULAR

## 2022-05-18 NOTE — Addendum Note (Signed)
Addended by: Carter Kitten on: 05/18/2022 10:14 AM   Modules accepted: Orders

## 2022-05-18 NOTE — Progress Notes (Signed)
Riham Polyakov T. Fronie Holstein, MD, CAQ Sports Medicine [facility] [address] [place], [address]  Phone: [phone]  FAX: [phone]  Jorge Francis - 41 y.o. male  MRN [id number]  Date of Birth: 03/04/1981  Date: 05/18/2022  PCP: Joaquim Nam, MD  Referral: Joaquim Nam, MD  Chief Complaint  Patient presents with   Wrist Pain    Left   Subjective:   Jorge Francis is a 41 y.o. very pleasant male patient with Body mass index is 35.51 kg/m. who presents with the following:  He presents with wrist pain.  I saw him roughly 5 months ago and he was having some pain in the left wrist with a TFCC tear likely.  I did do a TFCC injection the last time he was here in the office.    Review of Systems is noted in the HPI, as appropriate  Objective:   BP 118/88   Pulse 78   Temp 97.9 F (36.6 C) (Oral)   Ht 5' 10.5" (1.791 m)   Wt 251 lb (113.9 kg)   SpO2 97%   BMI 35.51 kg/m   GEN: No acute distress; alert,appropriate. PULM: Breathing comfortably in no respiratory distress PSYCH: Normally interactive.   Laboratory and Imaging Data:  Assessment and Plan:     ICD-10-CM   1. TFCC (triangular fibrocartilage complex) tear, left, initial encounter  S63.592A     2. Acute pain of left wrist  M25.532      Procedure only  Aspiration/Injection Procedure Note Jorge Francis 1980/08/03 Date of procedure: 05/18/2022  Procedure: Small Joint Aspiration / Injection of Wrist, TFCC, L Indications: Pain  Procedure Details Verbal consent was obtained. Risks (including atrophy, and skin lightening), benefits, and alternatives were discussed. Prepped with Chloraprep and Ethyl Chloride used for anesthesia. Under sterile conditions,  the patient was injected just distal to the ulnar styloid with palm facing down. Aspiration showed no blood. Medication flowed freely without resistance.  Needle size: 22 gauge 1 1/2 inch Injection: 1/2 cc of  Lidocaine 1% and 1/2 cc of Kenalog 40 mg   Medication Management during today's office visit: No orders of the defined types were placed in this encounter.  Medications Discontinued During This Encounter  Medication Reason   baclofen (LIORESAL) 10 MG tablet Completed Course   diclofenac (VOLTAREN) 75 MG EC tablet Completed Course    Orders placed today for conditions managed today: No orders of the defined types were placed in this encounter.   Disposition: No follow-ups on file.  Dragon Medical One speech-to-text software was used for transcription in this dictation.  Possible transcriptional errors can occur using Animal nutritionist.   Signed,  Elpidio Galea. Rozlyn Yerby, MD   Outpatient Encounter Medications as of 05/18/2022  Medication Sig   citalopram (CELEXA) 20 MG tablet TAKE 1 AND 1/2 TABLETS DAILY BY MOUTH   ibuprofen (ADVIL) 200 MG tablet Take 200 mg by mouth every 6 (six) hours as needed.   loratadine (CLARITIN) 10 MG tablet Take 10 mg by mouth daily.   [DISCONTINUED] baclofen (LIORESAL) 10 MG tablet TAKE 1 TABLET (10 MG TOTAL) BY MOUTH AT BEDTIME AS NEEDED FOR MUSCLE SPASMS (SEDATION CAUTION).   [DISCONTINUED] diclofenac (VOLTAREN) 75 MG EC tablet Take 1 tablet (75 mg total) by mouth 2 (two) times daily.   No facility-administered encounter medications on file as of 05/18/2022.

## 2022-07-09 ENCOUNTER — Telehealth: Payer: BC Managed Care – PPO | Admitting: Family Medicine

## 2022-07-09 DIAGNOSIS — U071 COVID-19: Secondary | ICD-10-CM

## 2022-07-09 MED ORDER — MOLNUPIRAVIR EUA 200MG CAPSULE: 4.0000 | ORAL_CAPSULE | Freq: Two times a day (BID) | ORAL | 0 refills | Status: AC

## 2022-07-09 NOTE — Patient Instructions (Addendum)
Lamont Dowdy, thank you for joining Freddy Finner, NP for today's virtual visit.  While this provider is not your primary care provider (PCP), if your PCP is located in our provider database this encounter information will be shared with them immediately following your visit.   A [facility] MyChart account gives you access to today's visit and all your visits, tests, and labs performed at [facility] " click here if you don't have a [facility] MyChart account or go to [email or web address]  Consent: (Patient) Jorge Francis provided verbal consent for this virtual visit at the beginning of the encounter.  Current Medications:  Current Outpatient Medications:    molnupiravir EUA (LAGEVRIO) 200 mg CAPS capsule, Take 4 capsules (800 mg total) by mouth 2 (two) times daily for 5 days., Disp: 40 capsule, Rfl: 0   citalopram (CELEXA) 20 MG tablet, TAKE 1 AND 1/2 TABLETS DAILY BY MOUTH, Disp: 270 tablet, Rfl: 1   ibuprofen (ADVIL) 200 MG tablet, Take 200 mg by mouth every 6 (six) hours as needed., Disp: , Rfl:    loratadine (CLARITIN) 10 MG tablet, Take 10 mg by mouth daily., Disp: , Rfl:    Medications ordered in this encounter:  Meds ordered this encounter  Medications   molnupiravir EUA (LAGEVRIO) 200 mg CAPS capsule    Sig: Take 4 capsules (800 mg total) by mouth 2 (two) times daily for 5 days.    Dispense:  40 capsule    Refill:  0    Order Specific Question:   Supervising Provider    Answer:   Merrilee Jansky [id number]     *If you need refills on other medications prior to your next appointment, please contact your pharmacy*  Follow-Up: Call back or seek an in-person evaluation if the symptoms worsen or if the condition fails to improve as anticipated.  [facility] [phone]  Other Instructions  Please keep well-hydrated and get plenty of rest. Start a saline nasal rinse to flush out your nasal passages. You can use plain Mucinex to help  thin congestion. If you have a humidifier, running in the bedroom at night. I want you to start OTC vitamin D3 1000 units daily, vitamin C 1000 mg daily, and a zinc supplement. Please take prescribed medications as directed.  You have been enrolled in a MyChart symptom monitoring program. Please answer these questions daily so we can keep track of how you are doing.  You were to quarantine for 5 days from onset of your symptoms.  After day 5, if you have had no fever and you are feeling better, you can end quarantine but need to mask for an additional 5 days. After day 5 if you have a fever or are having significant symptoms, please quarantine for full 10 days.  If you note any worsening of symptoms, any significant shortness of breath or any chest pain, please seek ER evaluation ASAP.  Please do not delay care!  COVID-19: What to Do if You Are Sick If you test positive and are an older adult or someone who is at high risk of getting very sick from COVID-19, treatment may be available. Contact a healthcare provider right away after a positive test to determine if you are eligible, even if your symptoms are mild right now. You can also visit a Test to Treat location and, if eligible, receive a prescription from a provider. Don't delay: Treatment must be started within the first few days to be effective. If  you have a fever, cough, or other symptoms, you might have COVID-19. Most people have mild illness and are able to recover at home. If you are sick: Keep track of your symptoms. If you have an emergency warning sign (including trouble breathing), call 911. Steps to help prevent the spread of COVID-19 if you are sick If you are sick with COVID-19 or think you might have COVID-19, follow the steps below to care for yourself and to help protect other people in your home and community. Stay home except to get medical care Stay home. Most people with COVID-19 have mild illness and can recover at  home without medical care. Do not leave your home, except to get medical care. Do not visit public areas and do not go to places where you are unable to wear a mask. Take care of yourself. Get rest and stay hydrated. Take over-the-counter medicines, such as acetaminophen, to help you feel better. Stay in touch with your doctor. Call before you get medical care. Be sure to get care if you have trouble breathing, or have any other emergency warning signs, or if you think it is an emergency. Avoid public transportation, ride-sharing, or taxis if possible. Get tested If you have symptoms of COVID-19, get tested. While waiting for test results, stay away from others, including staying apart from those living in your household. Get tested as soon as possible after your symptoms start. Treatments may be available for people with COVID-19 who are at risk for becoming very sick. Don't delay: Treatment must be started early to be effective--some treatments must begin within 5 days of your first symptoms. Contact your healthcare provider right away if your test result is positive to determine if you are eligible. Self-tests are one of several options for testing for the virus that causes COVID-19 and may be more convenient than laboratory-based tests and point-of-care tests. Ask your healthcare provider or your local health department if you need help interpreting your test results. You can visit your state, tribal, local, and territorial health department's website to look for the latest local information on testing sites. Separate yourself from other people As much as possible, stay in a specific room and away from other people and pets in your home. If possible, you should use a separate bathroom. If you need to be around other people or animals in or outside of the home, wear a well-fitting mask. Tell your close contacts that they may have been exposed to COVID-19. An infected person can spread COVID-19 starting  48 hours (or 2 days) before the person has any symptoms or tests positive. By letting your close contacts know they may have been exposed to COVID-19, you are helping to protect everyone. See COVID-19 and Animals if you have questions about pets. If you are diagnosed with COVID-19, someone from the health department may call you. Answer the call to slow the spread. Monitor your symptoms Symptoms of COVID-19 include fever, cough, or other symptoms. Follow care instructions from your healthcare provider and local health department. Your local health authorities may give instructions on checking your symptoms and reporting information. When to seek emergency medical attention Look for emergency warning signs* for COVID-19. If someone is showing any of these signs, seek emergency medical care immediately: Trouble breathing Persistent pain or pressure in the chest New confusion Inability to wake or stay awake Pale, gray, or blue-colored skin, lips, or nail beds, depending on skin tone *This list is not all possible symptoms. Please  call your medical provider for any other symptoms that are severe or concerning to you. Call 911 or call ahead to your local emergency facility: Notify the operator that you are seeking care for someone who has or may have COVID-19. Call ahead before visiting your doctor Call ahead. Many medical visits for routine care are being postponed or done by phone or telemedicine. If you have a medical appointment that cannot be postponed, call your doctor's office, and tell them you have or may have COVID-19. This will help the office protect themselves and other patients. If you are sick, wear a well-fitting mask You should wear a mask if you must be around other people or animals, including pets (even at home). Wear a mask with the best fit, protection, and comfort for you. You don't need to wear the mask if you are alone. If you can't put on a mask (because of trouble  breathing, for example), cover your coughs and sneezes in some other way. Try to stay at least 6 feet away from other people. This will help protect the people around you. Masks should not be placed on young children under age 19 years, anyone who has trouble breathing, or anyone who is not able to remove the mask without help. Cover your coughs and sneezes Cover your mouth and nose with a tissue when you cough or sneeze. Throw away used tissues in a lined trash can. Immediately wash your hands with soap and water for at least 20 seconds. If soap and water are not available, clean your hands with an alcohol-based hand sanitizer that contains at least 60% alcohol. Clean your hands often Wash your hands often with soap and water for at least 20 seconds. This is especially important after blowing your nose, coughing, or sneezing; going to the bathroom; and before eating or preparing food. Use hand sanitizer if soap and water are not available. Use an alcohol-based hand sanitizer with at least 60% alcohol, covering all surfaces of your hands and rubbing them together until they feel dry. Soap and water are the best option, especially if hands are visibly dirty. Avoid touching your eyes, nose, and mouth with unwashed hands. Handwashing Tips Avoid sharing personal household items Do not share dishes, drinking glasses, cups, eating utensils, towels, or bedding with other people in your home. Wash these items thoroughly after using them with soap and water or put in the dishwasher. Clean surfaces in your home regularly Clean and disinfect high-touch surfaces (for example, doorknobs, tables, handles, light switches, and countertops) in your "sick room" and bathroom. In shared spaces, you should clean and disinfect surfaces and items after each use by the person who is ill. If you are sick and cannot clean, a caregiver or other person should only clean and disinfect the area around you (such as your bedroom  and bathroom) on an as needed basis. Your caregiver/other person should wait as long as possible (at least several hours) and wear a mask before entering, cleaning, and disinfecting shared spaces that you use. Clean and disinfect areas that may have blood, stool, or body fluids on them. Use household cleaners and disinfectants. Clean visible dirty surfaces with household cleaners containing soap or detergent. Then, use a household disinfectant. Use a product from [employer]'s List N: Disinfectants for Coronavirus (COVID-19). Be sure to follow the instructions on the label to ensure safe and effective use of the product. Many products recommend keeping the surface wet with a disinfectant for a certain period of  time (look at "contact time" on the product label). You may also need to wear personal protective equipment, such as gloves, depending on the directions on the product label. Immediately after disinfecting, wash your hands with soap and water for 20 seconds. For completed guidance on cleaning and disinfecting your home, visit Complete Disinfection Guidance. Take steps to improve ventilation at home Improve ventilation (air flow) at home to help prevent from spreading COVID-19 to other people in your household. Clear out COVID-19 virus particles in the air by opening windows, using air filters, and turning on fans in your home. Use this interactive tool to learn how to improve air flow in your home. When you can be around others after being sick with COVID-19 Deciding when you can be around others is different for different situations. Find out when you can safely end home isolation. For any additional questions about your care, contact your healthcare provider or state or local health department. 10/01/2020 Content source: [facility] for Immunization and Respiratory Diseases ([facility]), Division of Viral Diseases This information is not intended to replace advice given to you by your health care  provider. Make sure you discuss any questions you have with your health care provider. Document Revised: 11/14/2020 Document Reviewed: 11/14/2020 Elsevier Patient Education  2022 [employer].      If you have been instructed to have an in-person evaluation today at a local Urgent Care facility, please use the link below. It will take you to a list of all of our available [facility], including address, phone number and hours of operation. Please do not delay care.  [facility]  If you or a family member do not have a primary care provider, use the link below to schedule a visit and establish care. When you choose a [facility] primary care physician or advanced practice provider, you gain a long-term partner in health. Find a Primary Care Provider  Learn more about [facility] in-office and virtual care options: [facility] - Get Care Now

## 2022-07-09 NOTE — Progress Notes (Signed)
Virtual Visit Consent   Jorge Francis, you are scheduled for a virtual visit with a [facility] provider today. Just as with appointments in the office, your consent must be obtained to participate. Your consent will be active for this visit and any virtual visit you may have with one of our providers in the next 365 days. If you have a MyChart account, a copy of this consent can be sent to you electronically.  As this is a virtual visit, video technology does not allow for your provider to perform a traditional examination. This may limit your provider's ability to fully assess your condition. If your provider identifies any concerns that need to be evaluated in person or the need to arrange testing (such as labs, EKG, etc.), we will make arrangements to do so. Although advances in technology are sophisticated, we cannot ensure that it will always work on either your end or our end. If the connection with a video visit is poor, the visit may have to be switched to a telephone visit. With either a video or telephone visit, we are not always able to ensure that we have a secure connection.  By engaging in this virtual visit, you consent to the provision of healthcare and authorize for your insurance to be billed (if applicable) for the services provided during this visit. Depending on your insurance coverage, you may receive a charge related to this service.  I need to obtain your verbal consent now. Are you willing to proceed with your visit today? Jorge Francis has provided verbal consent on 07/09/2022 for a virtual visit (video or telephone). Jorge Finner, NP  Date: 07/09/2022 1:30 PM  Virtual Visit via Video Note   I, Jorge Francis, connected with  Jorge Francis  ([id number], 07-Nov-1980) on 07/09/22 at  1:45 PM EST by a video-enabled telemedicine application and verified that I am speaking with the correct person using two identifiers.  Location: Patient: Virtual Visit Location Patient:  Home Provider: Virtual Visit Location Provider: Home Office   I discussed the limitations of evaluation and management by telemedicine and the availability of in person appointments. The patient expressed understanding and agreed to proceed.    History of Present Illness: Jorge Francis is a 41 y.o. who identifies as a male who was assigned male at birth, and is being seen today for COVID + starting with headache on Tuesday evening that would not go away with OTC.  Associated symptoms Sinus congestion, drainage. Has tried severe cold and flu. Denies fevers, chills, chest pain, shortness, sore throat, ear pain. Only one in household- but was out and about over the holiday.  Covid vaccines but no boosters recently.  Flu vaccine not this year.  Problems:  Patient Active Problem List   Diagnosis Date Noted   Wrist pain 03/27/2020   Back pain 10/11/2019   Plantar fasciitis 05/03/2019   Achilles tendinitis 01/23/2018   Elevated BP without diagnosis of hypertension 07/27/2016   Anxiety state 01/23/2015   Advance care planning 07/29/2014   Hypertriglyceridemia 07/23/2014   Routine general medical examination at a health care facility 02/15/2013   Allergic rhinitis 10/03/2010   Attention deficit disorder 09/01/2010    Allergies:  Allergies  Allergen Reactions   Penicillins     Tongue swells   Buspar [Buspirone] Other (See Comments)    Ineffective at low dose, sedation at higher dose   Venlafaxine     Worsening anxiety   Medications:  Current Outpatient Medications:  citalopram (CELEXA) 20 MG tablet, TAKE 1 AND 1/2 TABLETS DAILY BY MOUTH, Disp: 270 tablet, Rfl: 1   ibuprofen (ADVIL) 200 MG tablet, Take 200 mg by mouth every 6 (six) hours as needed., Disp: , Rfl:    loratadine (CLARITIN) 10 MG tablet, Take 10 mg by mouth daily., Disp: , Rfl:   Observations/Objective: Patient is well-developed, well-nourished in no acute distress.  Resting comfortably  at home.  Head is  normocephalic, atraumatic.  No labored breathing.  Speech is clear and coherent with logical content.  Patient is alert and oriented at baseline.    Assessment and Plan:  1. COVID-19  - molnupiravir EUA (LAGEVRIO) 200 mg CAPS capsule; Take 4 capsules (800 mg total) by mouth 2 (two) times daily for 5 days.  Dispense: 40 capsule; Refill: 0  - Continue OTC symptomatic management of choice - Will send OTC vitamins and supplement information through AVS - Take meds as prescribed - Patient enrolled in MyChart symptom monitoring - Push fluids - Rest as needed - Discussed return precautions and when to seek in-person evaluation, sent via AVS as well    Reviewed side effects, risks and benefits of medication.    Patient acknowledged agreement and understanding of the plan.   Past Medical, Surgical, Social History, Allergies, and Medications have been Reviewed.    Follow Up Instructions: I discussed the assessment and treatment plan with the patient. The patient was provided an opportunity to ask questions and all were answered. The patient agreed with the plan and demonstrated an understanding of the instructions.  A copy of instructions were sent to the patient via MyChart unless otherwise noted below.    The patient was advised to call back or seek an in-person evaluation if the symptoms worsen or if the condition fails to improve as anticipated.  Time:  I spent 10 minutes with the patient via telehealth technology discussing the above problems/concerns.    Jorge Finner, NP

## 2022-08-05 ENCOUNTER — Encounter: Payer: Self-pay | Admitting: Family Medicine

## 2022-08-06 ENCOUNTER — Telehealth: Payer: Self-pay | Admitting: Family Medicine

## 2022-08-06 DIAGNOSIS — E781 Pure hyperglyceridemia: Secondary | ICD-10-CM

## 2022-08-06 NOTE — Telephone Encounter (Signed)
Patient called and stated can he do his CPE labs at [facility]. Patient physical is scheduled on 09/14/2022

## 2022-08-09 NOTE — Telephone Encounter (Signed)
Ordered cmet and lipid.

## 2022-08-09 NOTE — Addendum Note (Signed)
Addended by: Tonia Ghent on: 08/09/2022 08:22 PM   Modules accepted: Orders

## 2022-08-10 NOTE — Telephone Encounter (Signed)
Called and left message on patients vm that labs have been ordered.

## 2022-08-20 DIAGNOSIS — M25572 Pain in left ankle and joints of left foot: Secondary | ICD-10-CM | POA: Diagnosis not present

## 2022-08-20 DIAGNOSIS — M79672 Pain in left foot: Secondary | ICD-10-CM | POA: Diagnosis not present

## 2022-08-24 ENCOUNTER — Ambulatory Visit: Payer: BC Managed Care – PPO | Admitting: Family Medicine

## 2022-09-08 ENCOUNTER — Other Ambulatory Visit (INDEPENDENT_AMBULATORY_CARE_PROVIDER_SITE_OTHER): Payer: BC Managed Care – PPO

## 2022-09-08 DIAGNOSIS — E781 Pure hyperglyceridemia: Secondary | ICD-10-CM | POA: Diagnosis not present

## 2022-09-08 LAB — COMPREHENSIVE METABOLIC PANEL
ALT: 29 U/L (ref 0–53)
AST: 22 U/L (ref 0–37)
Albumin: 4.3 g/dL (ref 3.5–5.2)
Alkaline Phosphatase: 103 U/L (ref 39–117)
BUN: 12 mg/dL (ref 6–23)
CO2: 22 mEq/L (ref 19–32)
Calcium: 9.7 mg/dL (ref 8.4–10.5)
Chloride: 103 mEq/L (ref 96–112)
Creatinine, Ser: 0.77 mg/dL (ref 0.40–1.50)
GFR: 111.1 mL/min (ref >60.00)
Glucose, Bld: 88 mg/dL (ref 70–99)
Potassium: 4.4 mEq/L (ref 3.5–5.1)
Sodium: 137 mEq/L (ref 135–145)
Total Bilirubin: 0.7 mg/dL (ref 0.2–1.2)
Total Protein: 7 g/dL (ref 6.0–8.3)

## 2022-09-08 LAB — LDL CHOLESTEROL, DIRECT: Direct LDL: 147 mg/dL

## 2022-09-08 LAB — LIPID PANEL
Cholesterol: 218 mg/dL — ABNORMAL HIGH (ref 0–200)
HDL: 32.8 mg/dL — ABNORMAL LOW (ref >39.00)
NonHDL: 184.74
Total CHOL/HDL Ratio: 7
Triglycerides: 263 mg/dL — ABNORMAL HIGH (ref 0.0–149.0)
VLDL: 52.6 mg/dL — ABNORMAL HIGH (ref 0.0–40.0)

## 2022-09-14 ENCOUNTER — Encounter: Payer: Self-pay | Admitting: Family Medicine

## 2022-09-14 ENCOUNTER — Ambulatory Visit (INDEPENDENT_AMBULATORY_CARE_PROVIDER_SITE_OTHER): Payer: BC Managed Care – PPO | Admitting: Family Medicine

## 2022-09-14 VITALS — BP 120/72 | HR 111 | Temp 97.4°F | Ht 70.5 in | Wt 254.0 lb

## 2022-09-14 DIAGNOSIS — Z7189 Other specified counseling: Secondary | ICD-10-CM

## 2022-09-14 DIAGNOSIS — F411 Generalized anxiety disorder: Secondary | ICD-10-CM

## 2022-09-14 DIAGNOSIS — Z0000 Encounter for general adult medical examination without abnormal findings: Secondary | ICD-10-CM | POA: Diagnosis not present

## 2022-09-14 DIAGNOSIS — M25579 Pain in unspecified ankle and joints of unspecified foot: Secondary | ICD-10-CM

## 2022-09-14 MED ORDER — CELECOXIB 200 MG PO CAPS: 200.0000 mg | ORAL_CAPSULE | Freq: Every day | ORAL | 2 refills | Status: DC | PRN

## 2022-09-14 MED ORDER — CITALOPRAM HYDROBROMIDE 20 MG PO TABS: ORAL_TABLET | ORAL | 1 refills | Status: DC

## 2022-09-14 NOTE — Patient Instructions (Signed)
Try celebrex but don't take with aleve or ibuprofen.  Tylenol is okay.   Update me as needed.   Take care.  Glad to see you.

## 2022-09-14 NOTE — Progress Notes (Unsigned)
CPE- See plan.  Routine anticipatory guidance given to patient.  See health maintenance.  The possibility exists that previously documented standard health maintenance information may have been brought forward from a previous encounter into this note.  If needed, that same information has been updated to reflect the current situation based on today's encounter.    Tetanus 2020 Flu shot prev done.   Shingles and PNA not due.  Covid vaccine prev done.   Colon and prostate cancer screening not due.   Living will d/w pt.  Wife designated if patient were incapacitated.  Diet and exercise d/w pt along with labs.   [facility]   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    He found out at the office visit that his house sold.  They are moving in April, into [employer].  Stressors with that d/w pt.  Still on citalopram, it helps.  D/w pt about continuing.    He used boot prev for ankle pain.  D/w pt about trial of voltaren gel, it hadn't helped in the past.  Has used prn oral nsaids, cautions d/w pt.  He had heart burn with ibuprofen/aleve.  Discussed options.  PMH and SH reviewed  Meds, vitals, and allergies reviewed.   ROS: Per HPI.  Unless specifically indicated otherwise in HPI, the patient denies:  General: fever. Eyes: acute vision changes ENT: sore throat Cardiovascular: chest pain Respiratory: SOB GI: vomiting GU: dysuria Musculoskeletal: acute back pain Derm: acute rash Neuro: acute motor dysfunction Psych: worsening mood Endocrine: polydipsia Heme: bleeding Allergy: hayfever  GEN: nad, alert and oriented HEENT: ncat NECK: supple w/o LA CV: rrr. PULM: ctab, no inc wob ABD: soft, +bs EXT: no edema SKIN: no acute rash L foot not ttp at OV.  Intact DP pulse.     The 10-year ASCVD risk score (Arnett DK, et al., 2019) is: 2.4%   Values used to calculate the score:     Age: 42 years     Sex: Male     Is Non-Hispanic African American: No      Diabetic: No     Tobacco smoker: No     Systolic Blood Pressure: 123456 mmHg     Is BP treated: No     HDL Cholesterol: 32.8 mg/dL     Total Cholesterol: 218 mg/dL

## 2022-09-16 DIAGNOSIS — M25579 Pain in unspecified ankle and joints of unspecified foot: Secondary | ICD-10-CM | POA: Insufficient documentation

## 2022-09-16 NOTE — Assessment & Plan Note (Signed)
Tetanus 2020 Flu shot prev done.   Shingles and PNA not due.  Covid vaccine prev done.   Colon and prostate cancer screening not due.   Living will d/w pt.  Wife designated if patient were incapacitated.   Diet and exercise d/w pt along with labs.   [facility]   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

## 2022-09-16 NOTE — Assessment & Plan Note (Signed)
Living will d/w pt.  Wife designated if patient were incapacitated.   ?

## 2022-09-16 NOTE — Assessment & Plan Note (Signed)
See above.  Continue citalopram as is.  It helped.  He is sober and still going to meetings.  I thanked him for his effort.

## 2022-09-16 NOTE — Assessment & Plan Note (Signed)
Could try Celebrex with routine cautions.  Okay to use Tylenol.  He will update me as needed.

## 2022-11-14 IMAGING — DX DG HAND COMPLETE 3+V*L*
3 series · 3 of 3 positions shown · non-contrast
Comparison: None.

CLINICAL DATA: Left index finger injury.

EXAM:
LEFT HAND - COMPLETE 3+ VIEW

[hand pa]
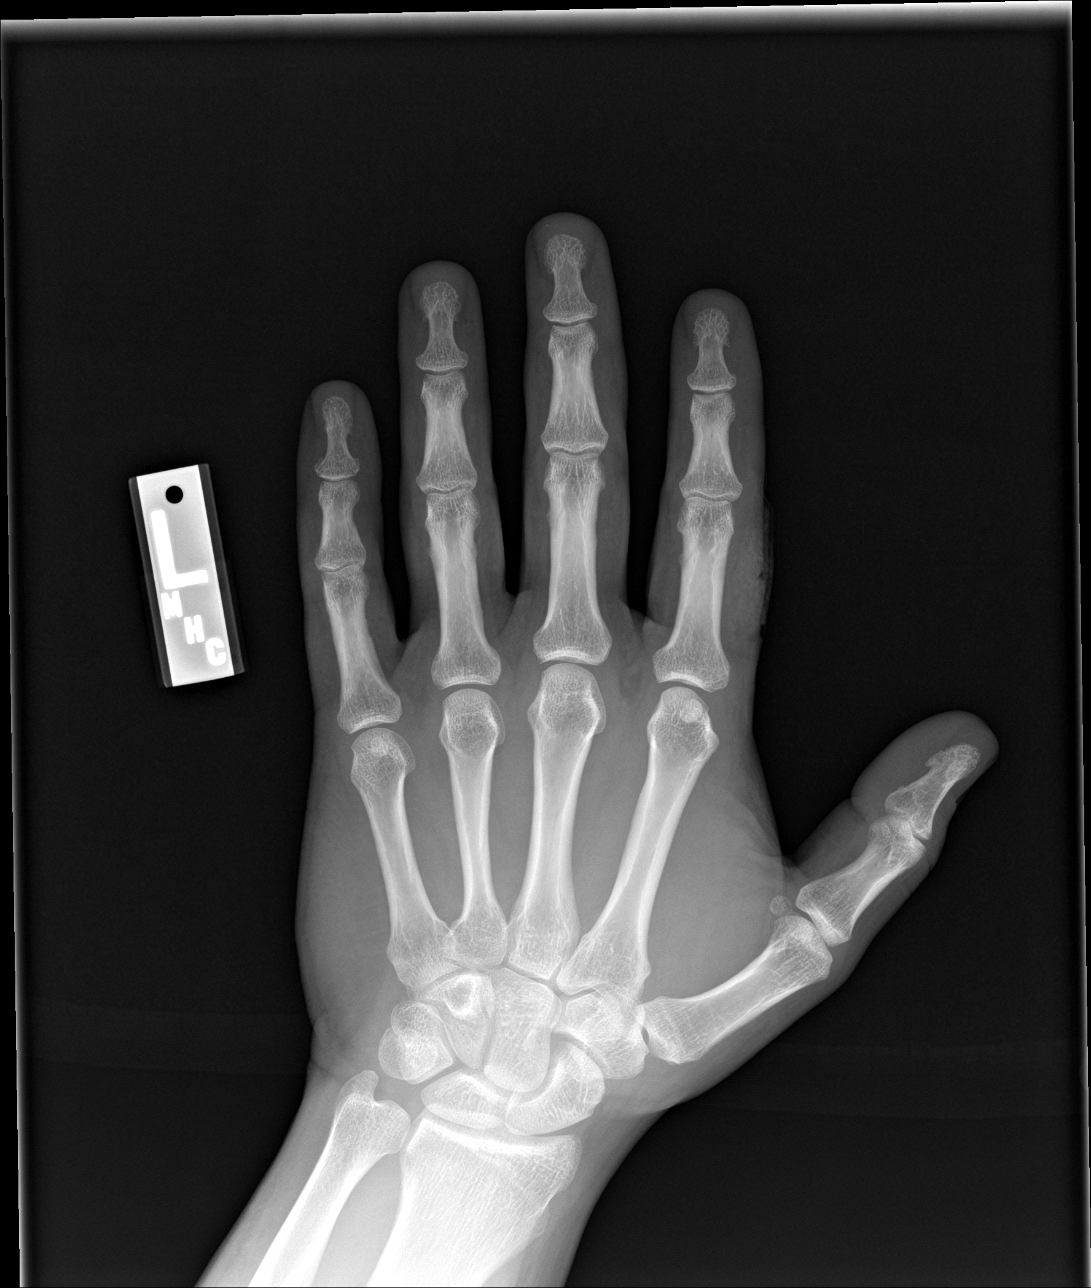

[hand obl]
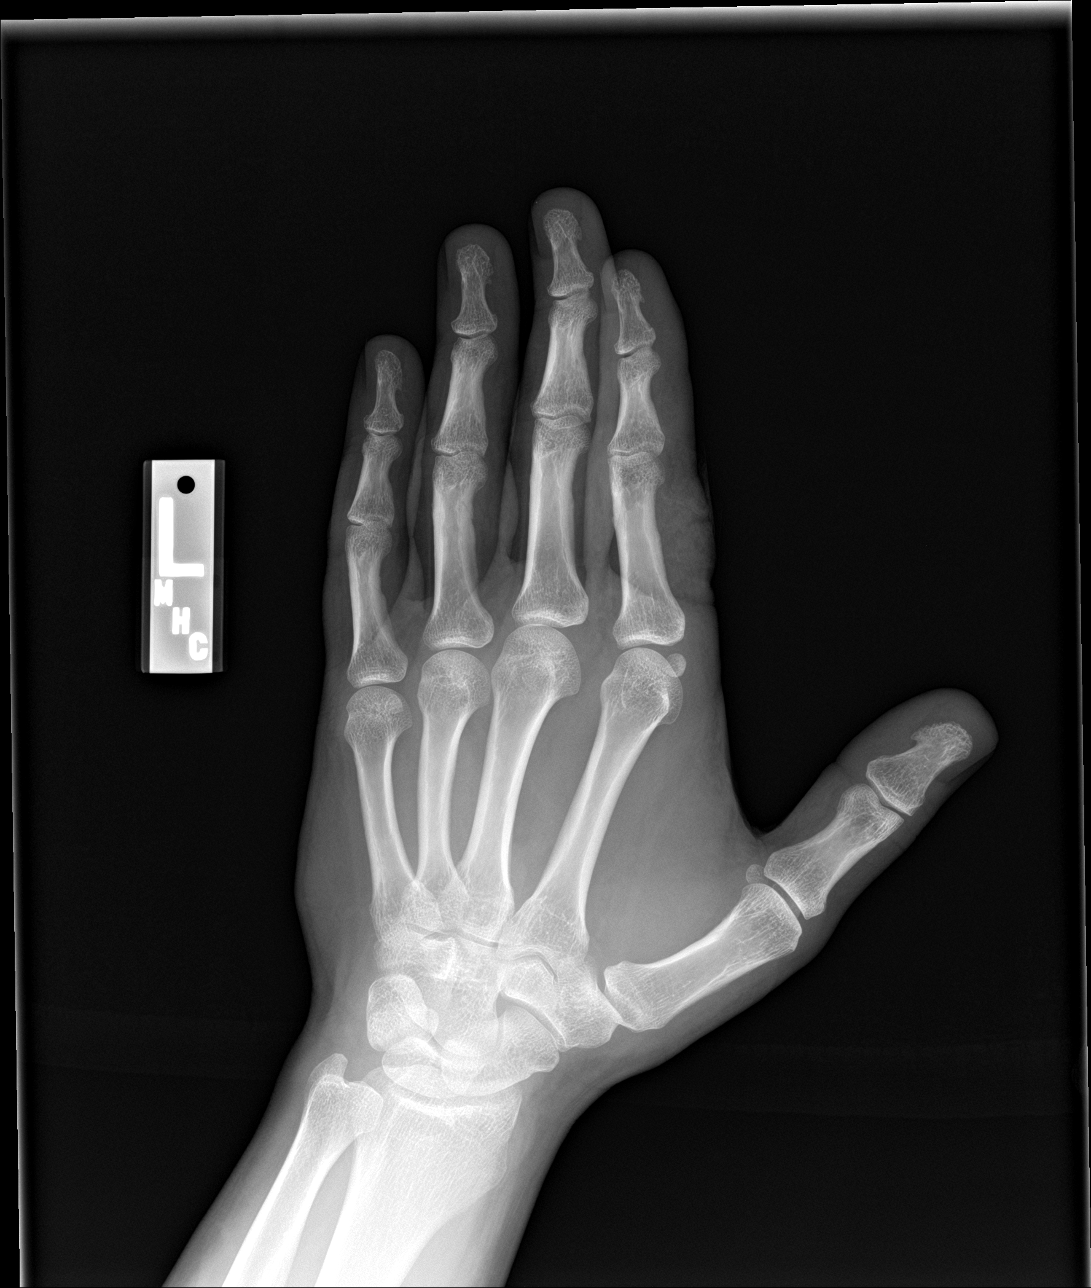

[hand lat]
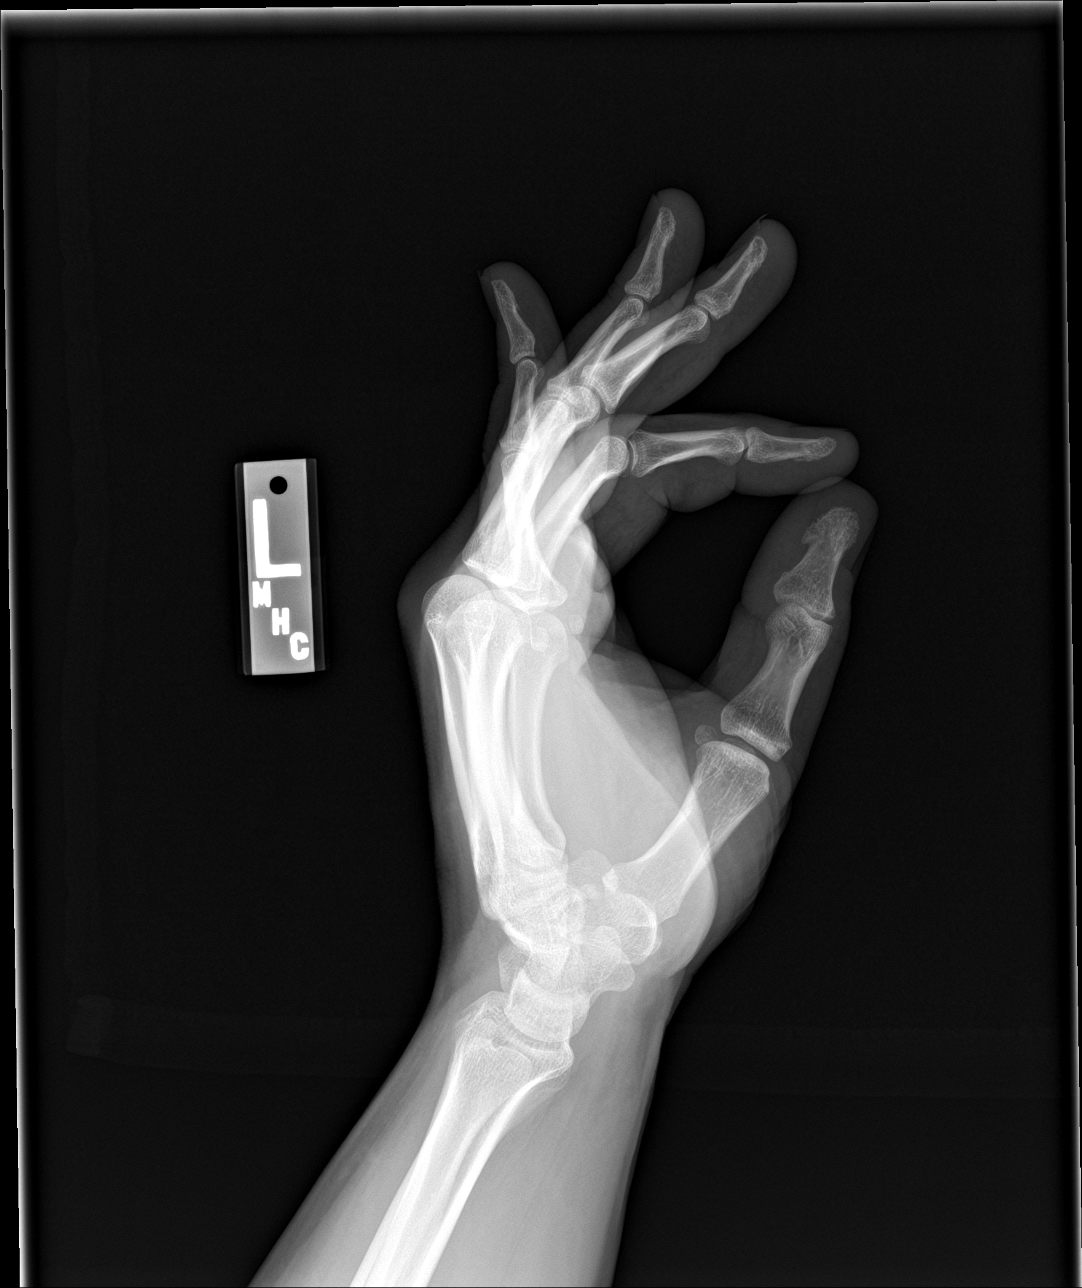

[3 of 3 positions shown; findings below may reference images not displayed]

FINDINGS: There is no evidence of fracture or dislocation. There is no
evidence of arthropathy or other focal bone abnormality. Soft tissue
swelling is seen adjacent to second proximal phalanx with possible
small associated laceration.
IMPRESSION: No fracture or dislocation is noted. Soft tissue swelling seen
adjacent to second proximal phalanx with possible small associated
laceration.

## 2022-12-10 ENCOUNTER — Other Ambulatory Visit: Payer: Self-pay | Admitting: Family Medicine

## 2023-01-01 DIAGNOSIS — M79672 Pain in left foot: Secondary | ICD-10-CM | POA: Diagnosis not present

## 2023-01-01 DIAGNOSIS — S93402A Sprain of unspecified ligament of left ankle, initial encounter: Secondary | ICD-10-CM | POA: Diagnosis not present

## 2023-04-05 ENCOUNTER — Ambulatory Visit (INDEPENDENT_AMBULATORY_CARE_PROVIDER_SITE_OTHER): Payer: BC Managed Care – PPO | Admitting: Family Medicine

## 2023-04-05 ENCOUNTER — Encounter: Payer: Self-pay | Admitting: Family Medicine

## 2023-04-05 VITALS — BP 120/82 | HR 94 | Temp 97.9°F | Ht 70.5 in | Wt 250.0 lb

## 2023-04-05 DIAGNOSIS — M545 Low back pain, unspecified: Secondary | ICD-10-CM

## 2023-04-05 LAB — URINALYSIS, ROUTINE W REFLEX MICROSCOPIC
Bilirubin Urine: NEGATIVE
Hgb urine dipstick: NEGATIVE
Ketones, ur: NEGATIVE
Leukocytes,Ua: NEGATIVE
Nitrite: NEGATIVE
Specific Gravity, Urine: 1.02 (ref 1.000–1.030)
Total Protein, Urine: NEGATIVE
Urine Glucose: NEGATIVE
Urobilinogen, UA: 0.2 (ref 0.0–1.0)
pH: 7 (ref 5.0–8.0)

## 2023-04-05 MED ORDER — PREDNISONE 10 MG PO TABS: ORAL_TABLET | ORAL | 0 refills | Status: DC

## 2023-04-05 MED ORDER — GABAPENTIN 100 MG PO CAPS: 100.0000 mg | ORAL_CAPSULE | Freq: Three times a day (TID) | ORAL | 0 refills | Status: DC | PRN

## 2023-04-05 NOTE — Patient Instructions (Signed)
Prednisone with food, taper sooner if needed.   Sedation caution on gabapentin.  Take care.  Glad to see you. Update me as needed.

## 2023-04-05 NOTE — Progress Notes (Unsigned)
H/o back pain over the last few years.  Previous episodic symptoms.    Now with lower back pain.  Presumed muscle spasm.  Baclofen and anti-inflammatories are not working. This flare was worse than typical.  Going on for about 2 weeks.  Pain every day but not constant until today.  Pain laying down and sitting down.  Constant pressure with occ "zing" of pain.  In the R lower back, doesn't radiate down the R leg but is noticed in the right buttock.  No midline or L sided pain.  Less pain upright, walking.  No FCNAVD.   This pain is similar to prev but worse than normal.  No help with baclofen celebrex tylenol.    R 3nd and 4th finger numbness and pain after repetitive hammer work, d/w pt about trying a cushioned grip.  Normal hand sensation now.    Meds, vitals, and allergies reviewed.   ROS: Per HPI unless specifically indicated in ROS section   Nad Ncat Neck supple, no lymphadenopathy. Rrr Ctab Abd soft, nontender. Right lower back sore.  Nontender midline.  No rash. R lower back pain with B (but R>L) hip flexion.   S/S wnl BLE

## 2023-04-07 NOTE — Assessment & Plan Note (Addendum)
D/w pt about HEP.   Check urinalysis but the concern here is for a primary musculoskeletal issue. Concern for radicular source.  Anatomy discussed with patient.  He could have concurrent muscle spasm in addition to radicular source. Still okay for outpatient follow-up. Prednisone with food, taper sooner if needed.   Sedation caution on gabapentin.  Discussed with patient about rationale for use with both medications and routine cautions given.  He can update me as needed.

## 2023-04-14 ENCOUNTER — Encounter: Payer: Self-pay | Admitting: Family

## 2023-04-14 ENCOUNTER — Ambulatory Visit: Payer: BC Managed Care – PPO | Admitting: Family

## 2023-04-14 ENCOUNTER — Other Ambulatory Visit (INDEPENDENT_AMBULATORY_CARE_PROVIDER_SITE_OTHER): Payer: BC Managed Care – PPO

## 2023-04-14 ENCOUNTER — Ambulatory Visit (INDEPENDENT_AMBULATORY_CARE_PROVIDER_SITE_OTHER): Payer: BC Managed Care – PPO

## 2023-04-14 VITALS — BP 130/76 | HR 106 | Temp 99.8°F | Ht 70.0 in | Wt 249.0 lb

## 2023-04-14 DIAGNOSIS — R509 Fever, unspecified: Secondary | ICD-10-CM

## 2023-04-14 DIAGNOSIS — R059 Cough, unspecified: Secondary | ICD-10-CM | POA: Diagnosis not present

## 2023-04-14 LAB — URINALYSIS, ROUTINE W REFLEX MICROSCOPIC
Leukocytes,Ua: NEGATIVE
Nitrite: NEGATIVE
Specific Gravity, Urine: 1.025 (ref 1.000–1.030)
Urine Glucose: NEGATIVE
Urobilinogen, UA: 0.2 (ref 0.0–1.0)
pH: 6 (ref 5.0–8.0)

## 2023-04-14 LAB — POCT RAPID STREP A (OFFICE): Rapid Strep A Screen: NEGATIVE

## 2023-04-14 LAB — POCT INFLUENZA A/B
Influenza A, POC: NEGATIVE
Influenza B, POC: NEGATIVE

## 2023-04-14 LAB — POC COVID19 BINAXNOW: SARS Coronavirus 2 Ag: NEGATIVE

## 2023-04-14 NOTE — Assessment & Plan Note (Addendum)
Negative point-of-care flu, COVID and strep throat.  Benign abdominal exam.  Tachycardic expected in the setting of febrile illness.  Walking SaO2 remained greater than 94%.  He was not labored in his speech as we walked up and down the hallway.  Discussed with patient more likely viral etiology due to abrupt onset.  Encouraged hydration and close vigilance in regards to persistent fever, tachycardia.  Pending chest x-ray, labs and urine.  Patient will continue alternating with Tylenol and ibuprofen. He was comfortable going home and staying vigilant in regards to persistence or worsening symptoms.

## 2023-04-14 NOTE — Addendum Note (Signed)
Addended by: Jarvis Morgan D on: 04/14/2023 02:00 PM   Modules accepted: Orders

## 2023-04-14 NOTE — Patient Instructions (Signed)
Your strep, covid and flu are negative.  Pending chest x-ray, urine and labs.  Please monitor your heart rate and ensure that you are drinking plenty of water.  Please let me know if abdominal pain were to recur in any fashion as I would that would warrant stat abdominal imaging.  At this time I suspect a viral etiology. You may continue Tylenol and ibuprofen.      We will stay in close touch as results come in.

## 2023-04-14 NOTE — Progress Notes (Signed)
Assessment & Plan:  Fever, unspecified fever cause Assessment & Plan: Negative point-of-care flu, COVID and strep throat.  Benign abdominal exam.  Tachycardic expected in the setting of febrile illness.  Walking SaO2 remained greater than 94%.  He was not labored in his speech as we walked up and down the hallway.  Discussed with patient more likely viral etiology due to abrupt onset.  Encouraged hydration and close vigilance in regards to persistent fever, tachycardia.  Pending chest x-ray, labs and urine.  Patient will continue alternating with Tylenol and ibuprofen. He was comfortable going home and staying vigilant in regards to persistence or worsening symptoms.   Orders: -     POC COVID-19 BinaxNow -     POCT rapid strep A -     POCT Influenza A/B -     DG Chest 2 View; Future -     Culture, Group A Strep -     CBC with Differential/Platelet; Future -     Comprehensive metabolic panel; Future -     TSH; Future -     Urinalysis, Routine w reflex microscopic; Future -     Urine Culture; Future     Return precautions given.   Risks, benefits, and alternatives of the medications and treatment plan prescribed today were discussed, and patient expressed understanding.   Education regarding symptom management and diagnosis given to patient on AVS either electronically or printed.  No follow-ups on file.  Rennie Plowman, FNP  Subjective:    Patient ID: Jorge Francis, male    DOB: 05-Jun-1981, 42 y.o.   MRN: [id number]  CC: Jorge Francis is a 42 y.o. male who presents today for an acute visit.    HPI: Complains of chills x 2 nights ago.   Started abruptly that night   Endorses fatigue, episodic productive cough, sore throat bilaterally, decreased appetite.   He notes feeling SOB when 'trying to do something' such as shower. He is not sob when talking.   He notes yesterday he had 'soreness' diffusely over his abdomen when he 'rolled over in bed'. This has resolved today.    Last BM small amount brown stool this morning. He passing gas.   Denies CP, wheezing, leg swelling  Daughter is 32 years old with a fever this weekend; she was also negative for covid, flu and strep. She goes to preschool.   No recent travel.   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     TURMERIC PO Take by mouth.     No current facility-administered medications on file prior to visit.    Review of Systems  Constitutional:  Positive for chills and fever.  HENT:  Positive for sneezing. Negative for ear pain and sinus pressure.   Respiratory:  Positive for cough and shortness of breath.   Cardiovascular:  Negative for chest pain, palpitations and leg swelling.  Gastrointestinal:  Negative for abdominal pain (resolved),  nausea and vomiting.  Genitourinary:  Negative for dysuria.      Objective:    BP 130/76   Pulse (!) 106   Temp 99.8 F (37.7 C) (Oral)   Ht 5\' 10"  (1.778 m)   Wt 249 lb (112.9 kg)   SpO2 97%   BMI 35.73 kg/m   BP Readings from Last 3 Encounters:  04/14/23 130/76  04/05/23 120/82  09/14/22 120/72   Wt Readings from Last 3 Encounters:  04/14/23 249 lb (112.9 kg)  04/05/23 250 lb (113.4 kg)  09/14/22 254 lb (115.2 kg)    Physical Exam Vitals reviewed.  Constitutional:      Appearance: Normal appearance. He is well-developed.  HENT:     Head: Normocephalic and atraumatic.     Right Ear: Hearing, tympanic membrane,  ear canal and external ear normal. No decreased hearing noted. No drainage, swelling or tenderness. No middle ear effusion. Tympanic membrane is not injected, erythematous or bulging.     Left Ear: Hearing, tympanic membrane, ear canal and external ear normal. No decreased hearing noted. No drainage, swelling or tenderness.  No middle ear effusion. Tympanic membrane is not injected, erythematous or bulging.     Nose: Nose normal.     Right Sinus: No maxillary sinus tenderness or frontal sinus tenderness.     Left Sinus: No maxillary sinus tenderness or frontal sinus tenderness.     Mouth/Throat:     Pharynx: Uvula midline. No oropharyngeal exudate or posterior oropharyngeal erythema.     Tonsils: No tonsillar abscesses.  Eyes:     Conjunctiva/sclera: Conjunctivae normal.  Cardiovascular:     Rate and Rhythm: Regular rhythm.     Heart sounds: Normal heart sounds.  Pulmonary:     Effort: Pulmonary effort is normal. No respiratory distress.     Breath sounds: Normal breath sounds. No wheezing, rhonchi or rales.  Abdominal:     General: Bowel sounds are normal. There is no distension.     Palpations: Abdomen is soft. Abdomen is not rigid. There is no fluid wave or mass.     Tenderness: There is no abdominal tenderness. There is no right CVA tenderness, left CVA tenderness, guarding or rebound. Negative signs include Murphy's sign.       Comments: Surgical scar noted No abdominal pain.  No guarding or rebound.    Lymphadenopathy:     Head:     Right side of head: No submental, submandibular, tonsillar, preauricular, posterior auricular or occipital adenopathy.     Left side of head: No submental, submandibular, tonsillar, preauricular, posterior auricular or occipital adenopathy.     Cervical: No cervical adenopathy.  Skin:    General: Skin is warm and dry.  Neurological:     Mental Status: He is alert.  Psychiatric:        Speech: Speech normal.        Behavior: Behavior normal.

## 2023-04-15 ENCOUNTER — Ambulatory Visit: Payer: BC Managed Care – PPO

## 2023-04-15 ENCOUNTER — Other Ambulatory Visit: Payer: Self-pay | Admitting: Family

## 2023-04-15 ENCOUNTER — Encounter: Payer: Self-pay | Admitting: Family

## 2023-04-15 ENCOUNTER — Telehealth: Payer: Self-pay | Admitting: Family

## 2023-04-15 DIAGNOSIS — R899 Unspecified abnormal finding in specimens from other organs, systems and tissues: Secondary | ICD-10-CM | POA: Diagnosis not present

## 2023-04-15 LAB — URINE CULTURE
MICRO NUMBER:: 15542383
Result:: NO GROWTH
SPECIMEN QUALITY:: ADEQUATE

## 2023-04-15 LAB — CBC WITH DIFFERENTIAL/PLATELET
Basophils Absolute: 0.1 10*3/uL (ref 0.0–0.1)
Basophils Relative: 1.5 % (ref 0.0–3.0)
Eosinophils Absolute: 0 10*3/uL (ref 0.0–0.7)
Eosinophils Relative: 0.1 % (ref 0.0–5.0)
HCT: 49.4 % (ref 39.0–52.0)
Hemoglobin: 16.3 g/dL (ref 13.0–17.0)
Lymphocytes Relative: 9.6 % — ABNORMAL LOW (ref 12.0–46.0)
Lymphs Abs: 1 10*3/uL (ref 0.7–4.0)
MCHC: 33 g/dL (ref 30.0–36.0)
MCV: 89.1 fL (ref 78.0–100.0)
Monocytes Absolute: 1.6 10*3/uL — ABNORMAL HIGH (ref 0.1–1.0)
Monocytes Relative: 16.3 % — ABNORMAL HIGH (ref 3.0–12.0)
Neutro Abs: 7.2 10*3/uL (ref 1.4–7.7)
Neutrophils Relative %: 72.5 % (ref 43.0–77.0)
Platelets: 227 10*3/uL (ref 150.0–400.0)
RBC: 5.54 Mil/uL (ref 4.22–5.81)
RDW: 13.1 % (ref 11.5–15.5)
WBC: 10 10*3/uL (ref 4.0–10.5)

## 2023-04-15 LAB — COMPREHENSIVE METABOLIC PANEL
ALT: 26 U/L (ref 0–53)
AST: 22 U/L (ref 0–37)
Albumin: 4.5 g/dL (ref 3.5–5.2)
Alkaline Phosphatase: 93 U/L (ref 39–117)
BUN: 14 mg/dL (ref 6–23)
CO2: 27 meq/L (ref 19–32)
Calcium: 9.5 mg/dL (ref 8.4–10.5)
Chloride: 97 meq/L (ref 96–112)
Creatinine, Ser: 1.17 mg/dL (ref 0.40–1.50)
GFR: 77.03 mL/min (ref >60.00)
Glucose, Bld: 114 mg/dL — ABNORMAL HIGH (ref 70–99)
Potassium: 4 meq/L (ref 3.5–5.1)
Sodium: 135 meq/L (ref 135–145)
Total Bilirubin: 0.4 mg/dL (ref 0.2–1.2)
Total Protein: 7.4 g/dL (ref 6.0–8.3)

## 2023-04-15 LAB — TSH: TSH: 0.56 u[IU]/mL (ref 0.35–5.50)

## 2023-04-15 LAB — CK: Total CK: 58 U/L (ref 7–232)

## 2023-04-15 NOTE — Telephone Encounter (Signed)
I ordered CK lab Can this be added on to yesterday's labs?

## 2023-04-16 LAB — CULTURE, GROUP A STREP
Micro Number: 15542385
SPECIMEN QUALITY:: ADEQUATE

## 2023-04-29 ENCOUNTER — Encounter: Payer: Self-pay | Admitting: Family Medicine

## 2023-05-02 ENCOUNTER — Telehealth: Payer: Self-pay | Admitting: Family Medicine

## 2023-05-02 NOTE — Telephone Encounter (Signed)
Please triage patient about his back pain and let me know how he is feeling.  Thanks.

## 2023-05-03 ENCOUNTER — Ambulatory Visit (INDEPENDENT_AMBULATORY_CARE_PROVIDER_SITE_OTHER): Payer: BC Managed Care – PPO | Admitting: Family Medicine

## 2023-05-03 ENCOUNTER — Encounter: Payer: Self-pay | Admitting: Family Medicine

## 2023-05-03 VITALS — BP 138/84 | HR 87 | Temp 98.5°F | Ht 70.0 in | Wt 254.4 lb

## 2023-05-03 DIAGNOSIS — M545 Low back pain, unspecified: Secondary | ICD-10-CM

## 2023-05-03 DIAGNOSIS — J302 Other seasonal allergic rhinitis: Secondary | ICD-10-CM

## 2023-05-03 MED ORDER — CITALOPRAM HYDROBROMIDE 20 MG PO TABS: ORAL_TABLET | ORAL | 3 refills | Status: DC

## 2023-05-03 MED ORDER — TRIAMCINOLONE ACETONIDE 40 MG/ML IJ SUSP
40.0000 mg | Freq: Once | INTRAMUSCULAR | Status: AC
Start: 2023-05-03 — End: 2023-05-03
  Administered 2023-05-03: 40 mg via INTRAMUSCULAR

## 2023-05-03 NOTE — Telephone Encounter (Signed)
I spoke with pt;pt said back pain started mid Sept; pt was seen for back pain on 04/05/23 and pt said when he took prednisone it helped the back pain but made pt angry. Pt said the gabapentin did not seem to have an effect at all and  after 3 days pt stopped both meds. Pt said if still does not have pain. Pt said has pain when moves,bends over, coughs or sneezes. Pt has had no injury and no radiation of pain. Pt had pain most recently this morning with pain level 3 - 4 . Pt has no numbness or tingling in back or leg. No pain on lt side of back. Pt said in past has had steroid shot that does not make pt angry. Pt  scheduled appt with Dr Para March 05/03/23 at 12 noon with UC & ED precautions and pt voiced understanding.sending note to Dr Para March and Para March pool.

## 2023-05-03 NOTE — Patient Instructions (Signed)
You should get a call about the allergy clinic.  Kenalog-40 IM today.  Keep stretching.   Update [place] as needed.  Take care.  Glad to see you.

## 2023-05-03 NOTE — Progress Notes (Unsigned)
He is still having some pain, better than prior but not resolved.  He was more irritable on oral steroids.  R lower back pain.  He had tolerated steroid injections prev, better than oral steroids.  Still with pain bending over, doesn't radiate down the R leg.  Some pain with cough or sneeze.   Still taking citalopram, it helped his mood and refill sent.    Still dealing with allergies, using nasal saline and claritin.  L sided sinus pressure.  Wearing a mask when working outside.  Seasonal, spring and fall.  Discussed options.  Meds, vitals, and allergies reviewed.   ROS: Per HPI unless specifically indicated in ROS section   Nad Ncat Neck supple, no LA Rrr Ctab Abd soft Back nontender midline. Skin well-perfused.

## 2023-05-03 NOTE — Telephone Encounter (Signed)
Noted. Thanks.

## 2023-05-05 DIAGNOSIS — J302 Other seasonal allergic rhinitis: Secondary | ICD-10-CM | POA: Insufficient documentation

## 2023-05-05 NOTE — Assessment & Plan Note (Signed)
Kenalog 40 IM today at OV.  Discussed stretching.  He can update me as needed.

## 2023-05-05 NOTE — Assessment & Plan Note (Signed)
Longstanding/recurrent and reasonable to see allergy clinic.  Referral placed.

## 2023-05-11 ENCOUNTER — Encounter: Payer: Self-pay | Admitting: Family Medicine

## 2023-05-12 MED ORDER — CITALOPRAM HYDROBROMIDE 20 MG PO TABS: ORAL_TABLET | ORAL | Status: DC

## 2023-05-12 NOTE — Telephone Encounter (Signed)
I have called patient. He changed pharmacy about 5 days ago. Received refill from another manufacture. He has had increasing anxiety over the last 5 days. Is going on all day not just at times. Only other change in medication is steroid injection received in our office on 10/21. He is currently taking celexa 20mg  1 1/2 tab and has been on this for several years. Patient denies any suicidal ideation and if any he will go to ED or call  [phone].

## 2023-06-01 ENCOUNTER — Ambulatory Visit: Payer: BC Managed Care – PPO | Admitting: Family Medicine

## 2023-08-16 DIAGNOSIS — R519 Headache, unspecified: Secondary | ICD-10-CM | POA: Diagnosis not present

## 2023-08-16 DIAGNOSIS — M62838 Other muscle spasm: Secondary | ICD-10-CM | POA: Diagnosis not present

## 2023-08-16 DIAGNOSIS — J329 Chronic sinusitis, unspecified: Secondary | ICD-10-CM | POA: Diagnosis not present

## 2023-09-07 ENCOUNTER — Encounter: Payer: Self-pay | Admitting: Family Medicine

## 2023-09-08 ENCOUNTER — Other Ambulatory Visit: Payer: Self-pay | Admitting: Family Medicine

## 2023-09-08 MED ORDER — CITALOPRAM HYDROBROMIDE 40 MG PO TABS: 40.0000 mg | ORAL_TABLET | Freq: Every day | ORAL | 1 refills | Status: DC

## 2023-09-08 MED ORDER — CITALOPRAM HYDROBROMIDE 40 MG PO TABS: 40.0000 mg | ORAL_TABLET | Freq: Every day | ORAL | 3 refills | Status: DC

## 2023-12-14 ENCOUNTER — Other Ambulatory Visit: Payer: Self-pay | Admitting: Family Medicine

## 2023-12-14 NOTE — Telephone Encounter (Signed)
 LOV:05/03/23 NOV: NOTHING SCHEDULED LAST REFILL: CELECOXIB  200 MG CAPSULE 10/08/024 not showing prescribed by you

## 2023-12-14 NOTE — Telephone Encounter (Signed)
Okay to continue.  Sent.  Thanks. 

## 2024-01-08 ENCOUNTER — Ambulatory Visit (HOSPITAL_COMMUNITY)
Admission: EM | Admit: 2024-01-08 | Discharge: 2024-01-09 | Disposition: A | Discharge status: Home / Self Care | Attending: Orthopedic Surgery | Admitting: Orthopedic Surgery

## 2024-01-08 ENCOUNTER — Emergency Department (HOSPITAL_COMMUNITY)

## 2024-01-08 ENCOUNTER — Other Ambulatory Visit: Payer: Self-pay

## 2024-01-08 ENCOUNTER — Encounter (HOSPITAL_COMMUNITY): Payer: Self-pay | Admitting: *Deleted

## 2024-01-08 DIAGNOSIS — S61219A Laceration without foreign body of unspecified finger without damage to nail, initial encounter: Secondary | ICD-10-CM | POA: Diagnosis not present

## 2024-01-08 DIAGNOSIS — S66123A Laceration of flexor muscle, fascia and tendon of left middle finger at wrist and hand level, initial encounter: Secondary | ICD-10-CM | POA: Diagnosis not present

## 2024-01-08 DIAGNOSIS — S61412A Laceration without foreign body of left hand, initial encounter: Secondary | ICD-10-CM | POA: Diagnosis not present

## 2024-01-08 DIAGNOSIS — S61215A Laceration without foreign body of left ring finger without damage to nail, initial encounter: Secondary | ICD-10-CM | POA: Diagnosis not present

## 2024-01-08 DIAGNOSIS — S61213A Laceration without foreign body of left middle finger without damage to nail, initial encounter: Secondary | ICD-10-CM | POA: Diagnosis not present

## 2024-01-08 DIAGNOSIS — S66929A Laceration of unspecified muscle, fascia and tendon at wrist and hand level, unspecified hand, initial encounter: Secondary | ICD-10-CM

## 2024-01-08 DIAGNOSIS — W260XXA Contact with knife, initial encounter: Secondary | ICD-10-CM | POA: Insufficient documentation

## 2024-01-08 DIAGNOSIS — S66125A Laceration of flexor muscle, fascia and tendon of left ring finger at wrist and hand level, initial encounter: Secondary | ICD-10-CM | POA: Diagnosis not present

## 2024-01-08 DIAGNOSIS — Z87891 Personal history of nicotine dependence: Secondary | ICD-10-CM | POA: Diagnosis not present

## 2024-01-08 MED ORDER — CEFAZOLIN SODIUM-DEXTROSE 1-4 GM/50ML-% IV SOLN
1.0000 g | Freq: Once | INTRAVENOUS | Status: AC
  Administered 2024-01-08: 1 g via INTRAVENOUS
  Filled 2024-01-08: qty 50

## 2024-01-08 MED ORDER — ONDANSETRON HCL 4 MG/2ML IJ SOLN: 4.0000 mg | Freq: Four times a day (QID) | INTRAMUSCULAR | Status: DC | PRN

## 2024-01-08 MED ORDER — FENTANYL CITRATE PF 50 MCG/ML IJ SOSY
50.0000 ug | PREFILLED_SYRINGE | INTRAMUSCULAR | Status: AC | PRN
  Administered 2024-01-08 – 2024-01-09 (×3): 50 ug via INTRAVENOUS
  Filled 2024-01-08 (×3): qty 1

## 2024-01-08 NOTE — ED Provider Notes (Signed)
 [place] EMERGENCY DEPARTMENT AT [facility] Provider Note   CSN: [id number] Arrival date & time: 01/08/24  1742     Patient presents with: lacerations   Jorge Francis is a 43 y.o. male.   HPI Patient presents with concern of wounds to his left 3rd and 4th digits.  Patient works on [employer], states that he was working on a knife, when it slipped, lacerated the 2 digits across the distal interphalangeal joint.  Since that time has had pain and inability to flex either digit distally.  No other injuries, no other complaints.  Last tetanus within 5 years.    Prior to Admission medications   Medication Sig Start Date End Date Taking? Authorizing Provider  celecoxib  (CELEBREX ) 200 MG capsule TAKE 1 CAPSULE BY MOUTH EVERY DAY AS NEEDED 12/14/23   Cleatus Arlyss RAMAN, MD  citalopram  (CELEXA ) 40 MG tablet Take 1 tablet (40 mg total) by mouth daily. 09/08/23   Cleatus Arlyss RAMAN, MD  loratadine (CLARITIN) 10 MG tablet Take 10 mg by mouth daily.    [provider]  TURMERIC PO Take by mouth.    [provider]    Allergies: Penicillins, Buspar [buspirone], and Venlafaxine     Review of Systems  Updated Vital Signs BP (!) 149/99 (BP Location: Right Arm)   Pulse 97   Temp 97.9 F (36.6 C)   Resp 18   Ht 5' 10 (1.778 m)   Wt 115.4 kg   SpO2 100%   BMI 36.50 kg/m   Physical Exam Vitals and nursing note reviewed.  Constitutional:      General: He is not in acute distress.    Appearance: He is well-developed.  HENT:     Head: Normocephalic and atraumatic.   Eyes:     Conjunctiva/sclera: Conjunctivae normal.    Cardiovascular:     Rate and Rhythm: Normal rate and regular rhythm.     Pulses: Normal pulses.  Pulmonary:     Effort: Pulmonary effort is normal. No respiratory distress.     Breath sounds: No stridor.  Abdominal:     General: There is no distension.   Musculoskeletal:       Arms:   Skin:    General: Skin is warm and dry.    Neurological:     Mental Status: He is alert and oriented to person, place, and time.     (all labs ordered are listed, but only abnormal results are displayed) Labs Reviewed - No data to display  EKG: None  Radiology: DG Hand Complete Left Result Date: 01/08/2024 CLINICAL DATA:  Finger lacerations EXAM: LEFT HAND - COMPLETE 3+ VIEW COMPARISON:  07/14/2020 FINDINGS: Bandage material about the third and fourth fingers. No radiopaque foreign body. No acute osseous abnormality. IMPRESSION: No acute osseous abnormality. Electronically Signed   By: Norman Gatlin M.D.   On: 01/08/2024 19:09     Procedures   Medications Ordered in the ED - No data to display                                  Medical Decision Making Patient presents with sustained wounds to his 3rd and 4th digits, left hand, concern for laceration of the tendons.  Tetanus up-to-date, with concern for tendon disruption I discussed this case with our hand surgery colleague.   Amount and/or Complexity of Data Reviewed External Data Reviewed: notes.    Details: Urgent care  Discussion of management or test interpretation with external provider(s): Discussed the patient's case with her hand surgeon.  Risk Decision regarding hospitalization.   8:33 PM I discussed patient's case with Dr. Delene.  Patient has been eating, so there will be a delay, but he will go to the OR tomorrow morning.     Final diagnoses:  Laceration of tendon of hand     Garrick Charleston, MD 01/08/24 2034
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
## 2024-01-08 NOTE — ED Notes (Signed)
 Patient's lacerations dressed with non-adhesive dressings and coban wrap.
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
## 2024-01-08 NOTE — ED Provider Triage Note (Cosign Needed Addendum)
 Emergency Medicine Provider Triage Evaluation Note  Jorge Francis , a 43 y.o. male  was evaluated in triage.  Pt complains of left hand lacerations. Cut with knife. Sent from urgent care given concern for tendon injuries has he cannot move the DIP joint of his left middle and ring finger. Did not have x-ray there. Tdap is up to date  Review of Systems  Positive:  Negative:   Physical Exam  BP (!) 149/99 (BP Location: Right Arm)   Pulse 97   Temp 97.9 F (36.6 C)   Resp 18   Ht 5' 10 (1.778 m)   Wt 115.4 kg   SpO2 100%   BMI 36.50 kg/m  Gen:   Awake, no distress   Resp:  Normal effort  MSK:   Moves extremities without difficulty  Other:  Palmar aspect of the left middle and ring finger with lacerations noted. No active bleeding. Able to range both PIP, unable to flex DIP of either finger. Good capillary refill  Medical Decision Making  Medically screening exam initiated at 6:26 PM.  Appropriate orders placed.  Jorge Francis was informed that the remainder of the evaluation will be completed by another provider, this initial triage assessment does not replace that evaluation, and the importance of remaining in the ED until their evaluation is complete.     Valarie Farace A, PA-C 01/08/24 1831    Latrelle Bazar A, PA-C 01/08/24 1840

## 2024-01-08 NOTE — ED Notes (Signed)
 Patient's lacerations irrigated and dressing change. Non-adherent dressing applied with coban wrap. Lacerations clean and no bleeding noted.

## 2024-01-08 NOTE — ED Notes (Signed)
 This nurse attempted an IV x1, attempt unsuccessful.

## 2024-01-09 ENCOUNTER — Encounter (HOSPITAL_COMMUNITY): Payer: Self-pay | Admitting: Orthopedic Surgery

## 2024-01-09 ENCOUNTER — Other Ambulatory Visit: Payer: Self-pay

## 2024-01-09 ENCOUNTER — Emergency Department (HOSPITAL_COMMUNITY): Admitting: Anesthesiology

## 2024-01-09 ENCOUNTER — Encounter (HOSPITAL_COMMUNITY)
Admission: EM | Disposition: A | Discharge status: Home / Self Care | Payer: Self-pay | Source: Home / Self Care | Attending: Emergency Medicine

## 2024-01-09 DIAGNOSIS — S56126A Laceration of flexor muscle, fascia and tendon of left ring finger at forearm level, initial encounter: Secondary | ICD-10-CM | POA: Diagnosis not present

## 2024-01-09 DIAGNOSIS — S66125A Laceration of flexor muscle, fascia and tendon of left ring finger at wrist and hand level, initial encounter: Secondary | ICD-10-CM | POA: Diagnosis not present

## 2024-01-09 DIAGNOSIS — S66123A Laceration of flexor muscle, fascia and tendon of left middle finger at wrist and hand level, initial encounter: Secondary | ICD-10-CM | POA: Diagnosis not present

## 2024-01-09 DIAGNOSIS — S61213A Laceration without foreign body of left middle finger without damage to nail, initial encounter: Secondary | ICD-10-CM | POA: Diagnosis not present

## 2024-01-09 DIAGNOSIS — S61215A Laceration without foreign body of left ring finger without damage to nail, initial encounter: Secondary | ICD-10-CM | POA: Diagnosis not present

## 2024-01-09 HISTORY — PX: TENDON REPAIR: SHX5111

## 2024-01-09 SURGERY — TENDON REPAIR
Anesthesia: General | Site: Middle Finger | Laterality: Left

## 2024-01-09 MED ORDER — FENTANYL CITRATE (PF) 250 MCG/5ML IJ SOLN
INTRAMUSCULAR | Status: AC
  Filled 2024-01-09: qty 5

## 2024-01-09 MED ORDER — CELECOXIB 200 MG PO CAPS: 200.0000 mg | ORAL_CAPSULE | Freq: Two times a day (BID) | ORAL | 0 refills | Status: AC

## 2024-01-09 MED ORDER — ACETAMINOPHEN 10 MG/ML IV SOLN
1000.0000 mg | Freq: Once | INTRAVENOUS | Status: DC | PRN
  Administered 2024-01-09: 1000 mg via INTRAVENOUS

## 2024-01-09 MED ORDER — CHLORHEXIDINE GLUCONATE 0.12 % MT SOLN
15.0000 mL | Freq: Once | OROMUCOSAL | Status: AC
  Administered 2024-01-09: 15 mL via OROMUCOSAL

## 2024-01-09 MED ORDER — OXYCODONE HCL 5 MG/5ML PO SOLN: 5.0000 mg | Freq: Once | ORAL | Status: AC | PRN

## 2024-01-09 MED ORDER — CEFAZOLIN SODIUM-DEXTROSE 2-4 GM/100ML-% IV SOLN
INTRAVENOUS | Status: AC
  Filled 2024-01-09: qty 100

## 2024-01-09 MED ORDER — CEFAZOLIN SODIUM-DEXTROSE 2-3 GM-%(50ML) IV SOLR
INTRAVENOUS | Status: DC | PRN
  Administered 2024-01-09: 2 g via INTRAVENOUS

## 2024-01-09 MED ORDER — ACETAMINOPHEN 10 MG/ML IV SOLN
INTRAVENOUS | Status: AC
  Filled 2024-01-09: qty 100

## 2024-01-09 MED ORDER — ORAL CARE MOUTH RINSE: 15.0000 mL | Freq: Once | OROMUCOSAL | Status: AC

## 2024-01-09 MED ORDER — ONDANSETRON HCL 4 MG/2ML IJ SOLN
INTRAMUSCULAR | Status: DC | PRN
  Administered 2024-01-09: 4 mg via INTRAVENOUS

## 2024-01-09 MED ORDER — OXYCODONE HCL 5 MG PO TABS
ORAL_TABLET | ORAL | Status: AC
  Filled 2024-01-09: qty 1

## 2024-01-09 MED ORDER — OXYCODONE HCL 5 MG PO TABS: 5.0000 mg | ORAL_TABLET | ORAL | 0 refills | Status: AC | PRN

## 2024-01-09 MED ORDER — CHLORHEXIDINE GLUCONATE 0.12 % MT SOLN
OROMUCOSAL | Status: AC
  Filled 2024-01-09: qty 15

## 2024-01-09 MED ORDER — SULFAMETHOXAZOLE-TRIMETHOPRIM 800-160 MG PO TABS: 1.0000 | ORAL_TABLET | Freq: Two times a day (BID) | ORAL | 0 refills | Status: AC

## 2024-01-09 MED ORDER — FENTANYL CITRATE (PF) 250 MCG/5ML IJ SOLN
INTRAMUSCULAR | Status: DC | PRN
  Administered 2024-01-09 (×2): 50 ug via INTRAVENOUS

## 2024-01-09 MED ORDER — LIDOCAINE HCL (CARDIAC) PF 100 MG/5ML IV SOSY
PREFILLED_SYRINGE | INTRAVENOUS | Status: DC | PRN
Start: 2024-01-09 — End: 2024-01-09
  Administered 2024-01-09: 80 mg via INTRATRACHEAL

## 2024-01-09 MED ORDER — PROPOFOL 10 MG/ML IV BOLUS
INTRAVENOUS | Status: DC | PRN
  Administered 2024-01-09: 200 mg via INTRAVENOUS

## 2024-01-09 MED ORDER — FENTANYL CITRATE (PF) 100 MCG/2ML IJ SOLN: 25.0000 ug | INTRAMUSCULAR | Status: DC | PRN

## 2024-01-09 MED ORDER — LACTATED RINGERS IV SOLN: INTRAVENOUS | Status: DC

## 2024-01-09 MED ORDER — LIDOCAINE HCL (PF) 1 % IJ SOLN
INTRAMUSCULAR | Status: AC
Start: 2024-01-09 — End: 2024-01-09
  Filled 2024-01-09: qty 30

## 2024-01-09 MED ORDER — OXYCODONE HCL 5 MG PO TABS
5.0000 mg | ORAL_TABLET | Freq: Once | ORAL | Status: AC | PRN
  Administered 2024-01-09: 5 mg via ORAL

## 2024-01-09 MED ORDER — MIDAZOLAM HCL 2 MG/2ML IJ SOLN
INTRAMUSCULAR | Status: AC
  Filled 2024-01-09: qty 2

## 2024-01-09 MED ORDER — BUPIVACAINE HCL (PF) 0.25 % IJ SOLN
INTRAMUSCULAR | Status: DC | PRN
  Administered 2024-01-09: 20 mL

## 2024-01-09 MED ORDER — FENTANYL CITRATE PF 50 MCG/ML IJ SOSY
50.0000 ug | PREFILLED_SYRINGE | INTRAMUSCULAR | Status: DC | PRN
  Administered 2024-01-09: 50 ug via INTRAVENOUS
  Filled 2024-01-09: qty 1

## 2024-01-09 MED ORDER — MIDAZOLAM HCL 2 MG/2ML IJ SOLN
INTRAMUSCULAR | Status: DC | PRN
  Administered 2024-01-09: 2 mg via INTRAVENOUS

## 2024-01-09 MED ORDER — BUPIVACAINE HCL (PF) 0.25 % IJ SOLN
INTRAMUSCULAR | Status: AC
  Filled 2024-01-09: qty 10

## 2024-01-09 MED ORDER — LIDOCAINE HCL (PF) 1 % IJ SOLN
INTRAMUSCULAR | Status: DC | PRN
  Administered 2024-01-09: 20 mL

## 2024-01-09 MED ORDER — DEXAMETHASONE SODIUM PHOSPHATE 10 MG/ML IJ SOLN
INTRAMUSCULAR | Status: DC | PRN
  Administered 2024-01-09: 10 mg via INTRAVENOUS

## 2024-01-09 SURGICAL SUPPLY — 50 items
BAG COUNTER SPONGE SURGICOUNT (BAG) ×1 IMPLANT
BNDG COMPR ESMARK 4X3 LF (GAUZE/BANDAGES/DRESSINGS) ×1 IMPLANT
BNDG ELASTIC 4X5.8 VLCR STR LF (GAUZE/BANDAGES/DRESSINGS) IMPLANT
BNDG GAUZE DERMACEA FLUFF 4 (GAUZE/BANDAGES/DRESSINGS) IMPLANT
CORD BIPOLAR FORCEPS 12FT (ELECTRODE) ×1 IMPLANT
COVER SURGICAL LIGHT HANDLE (MISCELLANEOUS) ×1 IMPLANT
CUFF TOURN SGL QUICK 18X4 (TOURNIQUET CUFF) IMPLANT
CUFF TRNQT CYL 24X4X16.5-23 (TOURNIQUET CUFF) IMPLANT
DRAPE OEC MINIVIEW 54X84 (DRAPES) IMPLANT
DRAPE SURG 17X23 STRL (DRAPES) ×1 IMPLANT
DURAPREP 26ML APPLICATOR (WOUND CARE) ×1 IMPLANT
GAUZE SPONGE 4X4 12PLY STRL (GAUZE/BANDAGES/DRESSINGS) IMPLANT
GAUZE XEROFORM 1X8 LF (GAUZE/BANDAGES/DRESSINGS) IMPLANT
GLOVE PROTEXIS LATEX SZ 7.5 (GLOVE) ×2 IMPLANT
GLOVE SURG LATEX 7.5 PF (GLOVE) ×2 IMPLANT
GOWN STRL REUS W/ TWL LRG LVL3 (GOWN DISPOSABLE) ×1 IMPLANT
GOWN STRL REUS W/ TWL XL LVL3 (GOWN DISPOSABLE) IMPLANT
GOWN STRL SURGICAL XL XLNG (GOWN DISPOSABLE) ×1 IMPLANT
KIT BASIN OR (CUSTOM PROCEDURE TRAY) ×1 IMPLANT
KIT TURNOVER KIT B (KITS) ×1 IMPLANT
MANIFOLD NEPTUNE II (INSTRUMENTS) ×1 IMPLANT
NDL 25GX 5/8IN NON SAFETY (NEEDLE) IMPLANT
NDL HYPO 25GX1X1/2 BEV (NEEDLE) IMPLANT
NDL SUT 1 .5 CRC FRENCH EYE (NEEDLE) IMPLANT
NEEDLE 25GX 5/8IN NON SAFETY (NEEDLE) ×1 IMPLANT
NEEDLE HYPO 25GX1X1/2 BEV (NEEDLE) ×1 IMPLANT
NS IRRIG 1000ML POUR BTL (IV SOLUTION) ×1 IMPLANT
PACK ORTHO EXTREMITY (CUSTOM PROCEDURE TRAY) ×1 IMPLANT
PAD ARMBOARD POSITIONER FOAM (MISCELLANEOUS) ×2 IMPLANT
PAD CAST 4YDX4 CTTN HI CHSV (CAST SUPPLIES) IMPLANT
SET CYSTO W/LG BORE CLAMP LF (SET/KITS/TRAYS/PACK) IMPLANT
SPIKE FLUID TRANSFER (MISCELLANEOUS) IMPLANT
SPLINT PLASTER CAST XFAST 4X15 (CAST SUPPLIES) IMPLANT
STRIP CLOSURE SKIN 1/2X4 (GAUZE/BANDAGES/DRESSINGS) IMPLANT
SUT ETHIBOND 3-0 V-5 (SUTURE) IMPLANT
SUT ETHILON 3 0 PS 1 (SUTURE) IMPLANT
SUT ETHILON 5 0 PS 2 18 (SUTURE) IMPLANT
SUT PROLENE 3 0 PS 2 (SUTURE) IMPLANT
SUT PROLENE 6 0 P 3 18 (SUTURE) IMPLANT
SUT SILK 4 0 PS 2 (SUTURE) IMPLANT
SUT SUPRAMID 3-0 (SUTURE) IMPLANT
SUT VIC AB 3-0 FS2 27 (SUTURE) IMPLANT
SUT VIC AB 4-0 P-3 18X BRD (SUTURE) IMPLANT
SYR 10ML LL (SYRINGE) IMPLANT
SYR CONTROL 10ML LL (SYRINGE) IMPLANT
TOWEL GREEN STERILE (TOWEL DISPOSABLE) ×1 IMPLANT
TOWEL GREEN STERILE FF (TOWEL DISPOSABLE) ×1 IMPLANT
TUBE CONNECTING 12X1/4 (SUCTIONS) IMPLANT
UNDERPAD 30X36 HEAVY ABSORB (UNDERPADS AND DIAPERS) ×1 IMPLANT
WATER STERILE IRR 1000ML POUR (IV SOLUTION) ×1 IMPLANT

## 2024-01-09 NOTE — Anesthesia Procedure Notes (Signed)
 Procedure Name: LMA Insertion Date/Time: 01/09/2024 10:56 AM  Performed by: Virgil Ee, CRNAPre-anesthesia Checklist: Patient identified, Patient being monitored, Timeout performed, Emergency Drugs available and Suction available Patient Re-evaluated:Patient Re-evaluated prior to induction Oxygen Delivery Method: Circle system utilized Preoxygenation: Pre-oxygenation with 100% oxygen Induction Type: IV induction Ventilation: Mask ventilation without difficulty LMA: LMA inserted LMA Size: 5.0 Tube type: Oral Number of attempts: 2 Placement Confirmation: positive ETCO2 and breath sounds checked- equal and bilateral Tube secured with: Tape Dental Injury: Teeth and Oropharynx as per pre-operative assessment

## 2024-01-09 NOTE — Transfer of Care (Signed)
 Immediate Anesthesia Transfer of Care Note  Patient: Jorge Francis  Procedure(s) Performed: TENDON REPAIR (Left: Middle Finger)  Patient Location: PACU  Anesthesia Type:General  Level of Consciousness: awake  Airway & Oxygen Therapy: Patient Spontanous Breathing  Post-op Assessment: Report given to RN and Post -op Vital signs reviewed and stable  Post vital signs: Reviewed and stable  Last Vitals:  Vitals Value Taken Time  BP 145/102 01/09/24 13:49  Temp    Pulse 114 01/09/24 13:51  Resp 12 01/09/24 13:51  SpO2 95 % 01/09/24 13:51  Vitals shown include unfiled device data.  Last Pain:  Vitals:   01/09/24 0905  TempSrc:   PainSc: 5          Complications: No notable events documented.

## 2024-01-09 NOTE — Discharge Instructions (Addendum)
 [facility] Post-Operative Instructions    General -These instructions are to compliment information given to you by your surgeon. -You may resume your normal diet as tolerated.  -You may resume your normal medications unless specifically instructed to stop taking a certain medication. -If you are not sure about restarting one of your medications after surgery, please contact the office during normal business hours and we will be able to assist you.   Post-Operative Dressings Splint: If you have a splint on your operative arm, it should stay on with all the dressings over it until you return for follow-up. It is ok to take a shower while you are wearing your splint, as long as you do not get it wet. The splint must be covered and kept clean and dry. If your splint gets wet, please call the clinic as you may need to come to clinic early for this to be changed.   If you have questions about your dressings, please call the clinic.   Post-operative Wound Care In general:  Any sutures or anything that will not fall off over time on their own, will be removed in clinic by our staff.  -If your surgical wound/incision was closed with non-absorbable sutures or staples, they will be removed at your first post-op visit.   -If your incision was closed with absorbable sutures, they do not need to be removed as they will dissolve on their own. They may be visible or buried underneath the skin.     Certain procedures such as fracture fixation may require the use of pins that stick out through the skin.  If you have visible pins, please be careful to not bump them or move them.    Regardless of any sutures, stickers or glue used to close your wound, do not submerge the wound in any standing water for at least 4 weeks after surgery.  It is okay to let the shower water run over the wound and gently clean the wound with soapy water then rinse and gently  pat dry.   Weightbearing/Activity  Do not use your operative extremity for any lifting or weight bearing    Pain Control - After your surgery, post-surgical discomfort or pain is normal. This discomfort can last several days to a few weeks. At certain times of the day (usually evenings/nights) your discomfort may be more intense.  - Do not drive while taking narcotic pain medications.   General Anesthesia or Bier Block: If you did not receive a nerve block during your surgery, you will need to start taking your pain medication shortly after your surgery and should continue to do so as prescribed by your surgeon.  Nerve Block:  If you received a nerve block, it may provide pain relief for one hour to up to two days after your surgery. As long as the nerve block is working, you will experience little or no sensation in the area the surgeon operated on.  As the nerve block wears off, you will begin to experience pain or discomfort. It is very important that you begin taking your prescribed pain medication before the nerve block fully wears off.  Treating your pain at the first sign of the block wearing off will ensure your pain is better controlled and more tolerable when full-sensation returns. Do not wait until the pain is intolerable, as the medicine will be less effective. It is better to treat pain in advance than to try  and catch up.  If the nerve block made your entire arm numb, you will be given an arm sling that you should wear until the block wears off, but no longer than 2 days unless otherwise instructed.   Pain Medication:  Typically, post operative pain can be managed by alternating tylenol  and an anti inflammatory medication.   We may also prescribe an opioid pain medication such as Oxycodone , Percocet (oxycodone  with Tylenol ) or Norco (hydrocodone with Tylenol ) for post-operative pain. Some of these medications contain Tylenol  (acetaminophen ) in them.  It takes between 30 and 45  minutes before pain medication starts to work. It is important to take your medication before your pain level gets too intense.   Nausea is a common side effect of many pain medications. You will want to eat something before taking your pain medicine to help prevent nausea.   If you are taking a prescription opioid pain medication that contains acetaminophen  (Tylenol ), we recommend that you do not take additional over the counter acetaminophen  (Tylenol ).   If you are prescribed oxycodone  WITHOUT acetaminophen  (Tylenol ) in it and you do not have a known allergy, we recommend you take over-the-counter acetaminophen  (Tylenol ) with the oxycodone .  Take over-the-counter stool softener such as Colace or Sennakot while taking narcotic pain medications to help prevent constipation.    Other pain relieving options:  Elevation: Elevating your operative extremity can be very helpful to reduce this pain. Prop your arm up on pillows to keep the operative site above the level of your heart. If you develop tingling from prolonged elevation, take a break from elevating and this should resolve.  Icing: If you do not have a splint/cast, using a cold pack to ice the affected area a few times a day (15 to 20 minutes at a time) can also help to relieve pain, reduce swelling and bruising.   If you can take nonsteroidal anti-inflammatory medications (NSAIDs, for example: Ibuprofen, Advil, Motrin, Aleve, etc.), you may take them to help control your pain. If you are unsure whether you can take anti-inflammatory medications, please check with your primary care provider.  If you are already taking a prescription of anti-inflammatory medications such as Celebrex  (celecoxib ) or Mobic  (meloxicam ) you should not take any additional anti-inflammatory drugs such as Ibuprofen, Advil, Motrin, or Aleve.    Follow Up Please call [facility] at [phone] if you do not receive or are unsure of your first  follow-up appointment.  You should see your surgeon or PA 10-16 days after your surgery unless otherwise instructed.    Please call the office for any problems, including the following:  - Excessive redness of the incisions - Drainage for more than 4 days - Fever of more than 101.5 F - Nausea/vomiting that does not stop  - Numbness, tingling, or discoloration of extremity  - Unable to drink fluids  - Uncontrollable pain     Marsa Christen, MD Hand and Upper Extremity Surgery [facility] [place] [phone]

## 2024-01-09 NOTE — Op Note (Addendum)
 NAME: Jorge Francis MEDICAL RECORD NO: [id number] DATE OF BIRTH: Jul 14, 1980 FACILITY: [facility] LOCATION: [facility] OR PHYSICIAN: MARSA EMERSON CHRISTEN MD   OPERATIVE REPORT   DATE OF PROCEDURE: 01/09/24    PREOPERATIVE DIAGNOSIS:  Left long and ring finger Zone 2 flexor tendon lacerations   POSTOPERATIVE DIAGNOSIS:  same   PROCEDURE:  1. Flexor tendon repair zone 2, Left long finger 2. Flexor tendon repair zone 2,  Left ring finger   SURGEON: MARSA HERO. Taran Haynesworth, M.D.   ASSISTANT: none   ANESTHESIA:  General and Local   INTRAVENOUS FLUIDS:  Per anesthesia flow sheet.   ESTIMATED BLOOD LOSS:  Minimal.   COMPLICATIONS:  None.   SPECIMENS:  none   TOURNIQUET TIME:    Total Tourniquet Time Documented: Upper Arm (Left) - 94 minutes Total: Upper Arm (Left) - 94 minutes    DISPOSITION:  Stable to PACU.   INDICATIONS:  43 year old male who sustained zone 2 flexor tendon lacerations to the left long and ring fingers when taking apart a sterling silver knife.  Afterward he was unable to flex either finger at the DIP joint.  We discussed risks, benefits, and alternatives as well as typical post operative recovery.  The patient elected to proceed with surgery.  OPERATIVE COURSE:   Patient was identified in holding.  The site, side, and surgery were confirmed with the patient. The patient was brought back to the operating room and transferred to the OR table.  He was positioned supine with the operative extremity on outstretched on a radiolucent hand table.  A well-padded tourniquet was placed on the upper arm of the operative extremity.    The patient was prepped and draped in typical sterile fashion. A timeout was performed and all were in agreement.  A digital block was given to both the long and ring finger with 5cc for each of a 50:50 mix of 1% lidocaine  plain and 0.25% bupivacaine plain .  The operative extremity was exsanguinated with Esmarch bandage and tourniquet inflated to  250 mmHg.  A Bruner incision was made on the ring and long fingers.  Full-thickness skin flaps were raised with care taken to protect the subcutaneous neurovascular structures.  The subcutaneous tissue was bluntly dissected down to the tendon sheath.  The wounds were explored to determine extent of injury.  The wounds were irrigated with 3L normal saline.    For the ring finger, there was a transverse laceration through the A4 pulley of the FDP tendon but the FDS tendon was still intact.  The neurovascular bundles were dissected out and protected.  The tendon was retracted proximally within the A3 pulley.  This was released and a tendon passer was used to pass the tendon through the A4 pulley to the distal stump.  The tendon was secured with a 25g needle to hold it in place.  Next a 3-0 Supramid suture was used to repair the FDP tendon of the ring finger with modified Kessler technique.  A 6-0 prolene was used as an epitendinous running suture.    For the long finger, there was a transverse laceration through the A4 pulley of the FDP tendon with the FDS tendon still intact.  The neurovascular bundles were dissected out and protected.  The tendon was retracted proximally within the A3 pulley.  This was released and a tendon passer was used to pass the tendon through the A4 pulley to the distal stump.  The tendon was secured with a 25g needle to  hold it in place.  Next a 3-0 Supramid suture was used to repair the FDP tendon of the long finger with modified Kessler technique.  A 6-0 prolene was used as an epitendinous running suture.    Next, the wounds were irrigated with normal saline.  The tourniquet was deflated.   Hemostasis was achieved with bipolar electrocautery.  The skin was closed with 5-0 nylons.  Xeroform was placed over the incision sites.  This was followed by gauze and sterile Webril.  Patient was then placed in a dorsal blocking splint.  Counts were correct x2.  He was awakened and taken to  PACU without issue.  POST-OP PLAN:  He will be discharged on pain medication and antibiotics.  Follow up in clinic in 1 week.  The patient will remain nonweightbearing in the splint until then.   At follow-up appointment he will be seen by occupational therapy and will have a brace made and will start modified Duran early passive motion protocol.     MARSA CHRISTELLA CHRISTEN, MD Electronically signed, 01/09/24

## 2024-01-09 NOTE — Anesthesia Preprocedure Evaluation (Addendum)
 Anesthesia Evaluation  Patient identified by MRN, date of birth, ID band Patient awake    Reviewed: Allergy & Precautions, H&P , NPO status , Patient's Chart, lab work & pertinent test results  Airway Mallampati: II  TM Distance: >3 FB Neck ROM: Full    Dental no notable dental hx.    Pulmonary former smoker   Pulmonary exam normal breath sounds clear to auscultation       Cardiovascular negative cardio ROS Normal cardiovascular exam Rhythm:Regular Rate:Normal     Neuro/Psych  PSYCHIATRIC DISORDERS Anxiety     negative neurological ROS  negative psych ROS   GI/Hepatic negative GI ROS, Neg liver ROS,,,  Endo/Other  negative endocrine ROS    Renal/GU negative Renal ROS     Musculoskeletal   Abdominal   Peds  Hematology negative hematology ROS (+)   Anesthesia Other Findings   Reproductive/Obstetrics                             Anesthesia Physical Anesthesia Plan  ASA: 2  Anesthesia Plan: General   Post-op Pain Management: Ofirmev  IV (intra-op)* and Toradol IV (intra-op)*   Induction:   PONV Risk Score and Plan: 3 and Ondansetron and Dexamethasone   Airway Management Planned: LMA and Oral ETT  Additional Equipment: None  Intra-op Plan:   Post-operative Plan: Extubation in OR  Informed Consent:   Plan Discussed with:   Anesthesia Plan Comments:        Anesthesia Quick Evaluation

## 2024-01-09 NOTE — Anesthesia Postprocedure Evaluation (Signed)
 Anesthesia Post Note  Patient: Nassir Neidert  Procedure(s) Performed: TENDON REPAIR (Left: Middle Finger)     Patient location during evaluation: PACU Anesthesia Type: General Level of consciousness: awake and alert Pain management: pain level controlled Vital Signs Assessment: post-procedure vital signs reviewed and stable Respiratory status: spontaneous breathing, nonlabored ventilation, respiratory function stable and patient connected to nasal cannula oxygen Cardiovascular status: blood pressure returned to baseline and stable Postop Assessment: no apparent nausea or vomiting Anesthetic complications: no  No notable events documented.  Last Vitals:  Vitals:   01/09/24 1400 01/09/24 1415  BP: (!) 140/92 (!) 153/101  Pulse: (!) 106 95  Resp: 14 10  Temp:  (!) 36.3 C  SpO2: 93% 96%    Last Pain:  Vitals:   01/09/24 1415  TempSrc:   PainSc: 0-No pain   Pain Goal:                   Georgie Eduardo L Emmalynn Pinkham

## 2024-01-10 ENCOUNTER — Encounter: Payer: Self-pay | Admitting: Family Medicine

## 2024-01-10 ENCOUNTER — Encounter (HOSPITAL_COMMUNITY): Payer: Self-pay | Admitting: Orthopedic Surgery

## 2024-01-17 DIAGNOSIS — M25642 Stiffness of left hand, not elsewhere classified: Secondary | ICD-10-CM | POA: Diagnosis not present

## 2024-01-17 DIAGNOSIS — S66123D Laceration of flexor muscle, fascia and tendon of left middle finger at wrist and hand level, subsequent encounter: Secondary | ICD-10-CM | POA: Diagnosis not present

## 2024-01-17 DIAGNOSIS — M79642 Pain in left hand: Secondary | ICD-10-CM | POA: Diagnosis not present

## 2024-01-17 DIAGNOSIS — S66125D Laceration of flexor muscle, fascia and tendon of left ring finger at wrist and hand level, subsequent encounter: Secondary | ICD-10-CM | POA: Diagnosis not present

## 2024-01-20 ENCOUNTER — Encounter: Payer: Self-pay | Admitting: Family Medicine

## 2024-01-21 ENCOUNTER — Telehealth: Payer: Self-pay

## 2024-01-21 DIAGNOSIS — S66125D Laceration of flexor muscle, fascia and tendon of left ring finger at wrist and hand level, subsequent encounter: Secondary | ICD-10-CM | POA: Diagnosis not present

## 2024-01-21 MED ORDER — DOXYCYCLINE HYCLATE 100 MG PO TABS: 100.0000 mg | ORAL_TABLET | Freq: Two times a day (BID) | ORAL | 0 refills | Status: DC

## 2024-01-21 NOTE — Telephone Encounter (Signed)
 Copied from CRM [id number]. Topic: Appointments - Appointment Scheduling >> Jan 21, 2024 11:59 AM Berneda FALCON wrote: Pt does not wish to schedule an appt. He is leaving to go out of town tomorrow and his request is that we call him in an antibiotic if at all possible please. He had surgery 2 weeks ago and feels he has caught a sinus infection as a result of this. He has green mucus and states they prescribed him an antibiotic for the surgery but as soon as he stopped taking it, he became sick with a sinus infection.  I offered an appt but he is going out of town and we do not have any openings today (virtual or in person) with any PCP and he is leaving tomorrow.  Are we able to call him in something? If so, this would be his preferred pharmacy.  [id number] - [place], [place] - [address] [place] [address] Phone: [phone] Fax: [phone] Hours: Not open 24 hours  Pt callback is [phone]

## 2024-01-21 NOTE — Telephone Encounter (Signed)
 Patient notified

## 2024-01-21 NOTE — Telephone Encounter (Signed)
 I would start doxycycline  and if not better then get rechecked soon, possibly on the trip.  Thanks.

## 2024-01-25 DIAGNOSIS — S61205A Unspecified open wound of left ring finger without damage to nail, initial encounter: Secondary | ICD-10-CM | POA: Diagnosis not present

## 2024-01-25 DIAGNOSIS — Z6836 Body mass index (BMI) 36.0-36.9, adult: Secondary | ICD-10-CM | POA: Diagnosis not present

## 2024-02-01 DIAGNOSIS — M79642 Pain in left hand: Secondary | ICD-10-CM | POA: Diagnosis not present

## 2024-02-01 DIAGNOSIS — M25642 Stiffness of left hand, not elsewhere classified: Secondary | ICD-10-CM | POA: Diagnosis not present

## 2024-02-02 DIAGNOSIS — F4321 Adjustment disorder with depressed mood: Secondary | ICD-10-CM | POA: Diagnosis not present

## 2024-02-07 DIAGNOSIS — F4321 Adjustment disorder with depressed mood: Secondary | ICD-10-CM | POA: Diagnosis not present

## 2024-02-08 DIAGNOSIS — M79642 Pain in left hand: Secondary | ICD-10-CM | POA: Diagnosis not present

## 2024-02-08 DIAGNOSIS — M25642 Stiffness of left hand, not elsewhere classified: Secondary | ICD-10-CM | POA: Diagnosis not present

## 2024-02-16 DIAGNOSIS — M79642 Pain in left hand: Secondary | ICD-10-CM | POA: Diagnosis not present

## 2024-02-16 DIAGNOSIS — M25642 Stiffness of left hand, not elsewhere classified: Secondary | ICD-10-CM | POA: Diagnosis not present

## 2024-02-17 DIAGNOSIS — F4321 Adjustment disorder with depressed mood: Secondary | ICD-10-CM | POA: Diagnosis not present

## 2024-02-22 DIAGNOSIS — M25642 Stiffness of left hand, not elsewhere classified: Secondary | ICD-10-CM | POA: Diagnosis not present

## 2024-02-22 DIAGNOSIS — M79642 Pain in left hand: Secondary | ICD-10-CM | POA: Diagnosis not present

## 2024-02-23 DIAGNOSIS — F4321 Adjustment disorder with depressed mood: Secondary | ICD-10-CM | POA: Diagnosis not present

## 2024-03-02 DIAGNOSIS — F4321 Adjustment disorder with depressed mood: Secondary | ICD-10-CM | POA: Diagnosis not present

## 2024-03-09 DIAGNOSIS — F4321 Adjustment disorder with depressed mood: Secondary | ICD-10-CM | POA: Diagnosis not present

## 2024-03-16 DIAGNOSIS — F4321 Adjustment disorder with depressed mood: Secondary | ICD-10-CM | POA: Diagnosis not present

## 2024-03-21 DIAGNOSIS — S66115A Strain of flexor muscle, fascia and tendon of left ring finger at wrist and hand level, initial encounter: Secondary | ICD-10-CM | POA: Diagnosis not present

## 2024-03-21 DIAGNOSIS — M66342 Spontaneous rupture of flexor tendons, left hand: Secondary | ICD-10-CM | POA: Diagnosis not present

## 2024-03-21 DIAGNOSIS — S66113A Strain of flexor muscle, fascia and tendon of left middle finger at wrist and hand level, initial encounter: Secondary | ICD-10-CM | POA: Diagnosis not present

## 2024-03-21 DIAGNOSIS — M67844 Other specified disorders of tendon, left hand: Secondary | ICD-10-CM | POA: Diagnosis not present

## 2024-03-21 DIAGNOSIS — S66315A Strain of extensor muscle, fascia and tendon of left ring finger at wrist and hand level, initial encounter: Secondary | ICD-10-CM | POA: Diagnosis not present

## 2024-03-23 ENCOUNTER — Encounter: Payer: Self-pay | Admitting: Family Medicine

## 2024-03-23 ENCOUNTER — Ambulatory Visit (INDEPENDENT_AMBULATORY_CARE_PROVIDER_SITE_OTHER): Admitting: Family Medicine

## 2024-03-23 VITALS — BP 136/84 | HR 72 | Temp 97.9°F | Ht 70.0 in | Wt 259.4 lb

## 2024-03-23 DIAGNOSIS — S6990XD Unspecified injury of unspecified wrist, hand and finger(s), subsequent encounter: Secondary | ICD-10-CM

## 2024-03-23 NOTE — Progress Notes (Unsigned)
  1. Status post left ring and long finger zone 2 flexor tendon repairs: Possible failure of tendon repair or more likely tendon adhesions. Incisions well healed. No motion at DIP joint of long and ring fingers. Passive range of motion of left long finger DIP joint is 0 to 40 degrees. Passive range of motion of left ring finger DIP joint is 15 degrees of hyperextension to 25 degrees of flexion.   - We discussed the options of doing nothing other than continued therapy at this time versus getting an ultrasound to determine the exact cause whether or not the tendons were ruptured versus adhesed or just going into surgery to either do a repair versus reconstruction of the flexor tendon or perform a flexor tenolysis. We also discussed the possible use of a product to help prevent tendon adhesions.  - Risks of surgery include infection, pain, damage to surrounding structures including nerves and blood vessels as well as tendons, ligaments, bone, need for additional procedures, and stiffness/loss of motion or function.   =========================== D/w pt about outside records, noted above.  He followed post op instructions after 1st surgery. He had repeat surgery 2 days ago.  Still bandaged.    No temp above 100.  Still in sig hand pain.  D/w pt about max celebrex  200mg  BID.  Taking oxycodone  q4h.    He talked to his sponsor about his oxycodone  use.    Meds, vitals, and allergies reviewed.   ROS: Per HPI unless specifically indicated in ROS section

## 2024-03-23 NOTE — Patient Instructions (Signed)
 I would ask the hand surgeon about possible predisposing issues that could contribute to tendon failure.  Take care.  Glad to see you.

## 2024-03-24 DIAGNOSIS — F4321 Adjustment disorder with depressed mood: Secondary | ICD-10-CM | POA: Diagnosis not present

## 2024-03-26 DIAGNOSIS — S6990XA Unspecified injury of unspecified wrist, hand and finger(s), initial encounter: Secondary | ICD-10-CM | POA: Insufficient documentation

## 2024-03-26 NOTE — Assessment & Plan Note (Signed)
 Discussed his situation.  He is using his pain medication appropriately and he is still splinted.  He has hand clinic follow-up.  The clinic he has seen has a good reputation for delivering good care to patients.  He followed his postop instructions.  Discussed options at this point.  I told him he could ask the hand surgeon about possible predisposing issues that could contribute to tendon failure.  I would not suspect a brief course of Cipro  years ago to have caused his situation.  I thank all involved.

## 2024-03-29 DIAGNOSIS — F4321 Adjustment disorder with depressed mood: Secondary | ICD-10-CM | POA: Diagnosis not present

## 2024-04-03 DIAGNOSIS — M79642 Pain in left hand: Secondary | ICD-10-CM | POA: Diagnosis not present

## 2024-04-03 DIAGNOSIS — M25642 Stiffness of left hand, not elsewhere classified: Secondary | ICD-10-CM | POA: Diagnosis not present

## 2024-04-05 DIAGNOSIS — F4321 Adjustment disorder with depressed mood: Secondary | ICD-10-CM | POA: Diagnosis not present

## 2024-04-10 DIAGNOSIS — F4321 Adjustment disorder with depressed mood: Secondary | ICD-10-CM | POA: Diagnosis not present

## 2024-04-18 DIAGNOSIS — U071 COVID-19: Secondary | ICD-10-CM | POA: Diagnosis not present

## 2024-04-18 DIAGNOSIS — Z6836 Body mass index (BMI) 36.0-36.9, adult: Secondary | ICD-10-CM | POA: Diagnosis not present

## 2024-04-18 DIAGNOSIS — M79642 Pain in left hand: Secondary | ICD-10-CM | POA: Diagnosis not present

## 2024-04-18 DIAGNOSIS — M25642 Stiffness of left hand, not elsewhere classified: Secondary | ICD-10-CM | POA: Diagnosis not present

## 2024-04-19 DIAGNOSIS — F4321 Adjustment disorder with depressed mood: Secondary | ICD-10-CM | POA: Diagnosis not present

## 2024-04-26 DIAGNOSIS — F4321 Adjustment disorder with depressed mood: Secondary | ICD-10-CM | POA: Diagnosis not present

## 2024-05-03 DIAGNOSIS — F4321 Adjustment disorder with depressed mood: Secondary | ICD-10-CM | POA: Diagnosis not present

## 2024-05-04 DIAGNOSIS — M79642 Pain in left hand: Secondary | ICD-10-CM | POA: Diagnosis not present

## 2024-05-04 DIAGNOSIS — M25642 Stiffness of left hand, not elsewhere classified: Secondary | ICD-10-CM | POA: Diagnosis not present

## 2024-05-05 DIAGNOSIS — S66125D Laceration of flexor muscle, fascia and tendon of left ring finger at wrist and hand level, subsequent encounter: Secondary | ICD-10-CM | POA: Diagnosis not present

## 2024-05-05 DIAGNOSIS — S66123D Laceration of flexor muscle, fascia and tendon of left middle finger at wrist and hand level, subsequent encounter: Secondary | ICD-10-CM | POA: Diagnosis not present

## 2024-05-05 DIAGNOSIS — S66123A Laceration of flexor muscle, fascia and tendon of left middle finger at wrist and hand level, initial encounter: Secondary | ICD-10-CM | POA: Diagnosis not present

## 2024-05-05 DIAGNOSIS — M6780 Other specified disorders of synovium and tendon, unspecified site: Secondary | ICD-10-CM | POA: Diagnosis not present

## 2024-05-05 DIAGNOSIS — S66125A Laceration of flexor muscle, fascia and tendon of left ring finger at wrist and hand level, initial encounter: Secondary | ICD-10-CM | POA: Diagnosis not present

## 2024-05-10 DIAGNOSIS — F4321 Adjustment disorder with depressed mood: Secondary | ICD-10-CM | POA: Diagnosis not present

## 2024-05-16 DIAGNOSIS — S66125D Laceration of flexor muscle, fascia and tendon of left ring finger at wrist and hand level, subsequent encounter: Secondary | ICD-10-CM | POA: Diagnosis not present

## 2024-05-16 DIAGNOSIS — S66123D Laceration of flexor muscle, fascia and tendon of left middle finger at wrist and hand level, subsequent encounter: Secondary | ICD-10-CM | POA: Diagnosis not present

## 2024-05-17 DIAGNOSIS — F4321 Adjustment disorder with depressed mood: Secondary | ICD-10-CM | POA: Diagnosis not present

## 2024-05-18 DIAGNOSIS — M25642 Stiffness of left hand, not elsewhere classified: Secondary | ICD-10-CM | POA: Diagnosis not present

## 2024-05-18 DIAGNOSIS — M79642 Pain in left hand: Secondary | ICD-10-CM | POA: Diagnosis not present

## 2024-05-24 DIAGNOSIS — F4321 Adjustment disorder with depressed mood: Secondary | ICD-10-CM | POA: Diagnosis not present

## 2024-05-30 DIAGNOSIS — S66115A Strain of flexor muscle, fascia and tendon of left ring finger at wrist and hand level, initial encounter: Secondary | ICD-10-CM | POA: Diagnosis not present

## 2024-05-30 DIAGNOSIS — G8918 Other acute postprocedural pain: Secondary | ICD-10-CM | POA: Diagnosis not present

## 2024-05-30 DIAGNOSIS — S66113A Strain of flexor muscle, fascia and tendon of left middle finger at wrist and hand level, initial encounter: Secondary | ICD-10-CM | POA: Diagnosis not present

## 2024-05-30 DIAGNOSIS — S66315A Strain of extensor muscle, fascia and tendon of left ring finger at wrist and hand level, initial encounter: Secondary | ICD-10-CM | POA: Diagnosis not present

## 2024-05-30 DIAGNOSIS — S66123A Laceration of flexor muscle, fascia and tendon of left middle finger at wrist and hand level, initial encounter: Secondary | ICD-10-CM | POA: Diagnosis not present

## 2024-05-30 DIAGNOSIS — S66125A Laceration of flexor muscle, fascia and tendon of left ring finger at wrist and hand level, initial encounter: Secondary | ICD-10-CM | POA: Diagnosis not present

## 2024-06-01 DIAGNOSIS — F4321 Adjustment disorder with depressed mood: Secondary | ICD-10-CM | POA: Diagnosis not present

## 2024-06-05 DIAGNOSIS — S66125D Laceration of flexor muscle, fascia and tendon of left ring finger at wrist and hand level, subsequent encounter: Secondary | ICD-10-CM | POA: Diagnosis not present

## 2024-06-05 DIAGNOSIS — S66123D Laceration of flexor muscle, fascia and tendon of left middle finger at wrist and hand level, subsequent encounter: Secondary | ICD-10-CM | POA: Diagnosis not present

## 2024-06-07 DIAGNOSIS — F4321 Adjustment disorder with depressed mood: Secondary | ICD-10-CM | POA: Diagnosis not present

## 2024-06-12 DIAGNOSIS — S66123D Laceration of flexor muscle, fascia and tendon of left middle finger at wrist and hand level, subsequent encounter: Secondary | ICD-10-CM | POA: Diagnosis not present

## 2024-06-12 DIAGNOSIS — S66125D Laceration of flexor muscle, fascia and tendon of left ring finger at wrist and hand level, subsequent encounter: Secondary | ICD-10-CM | POA: Diagnosis not present

## 2024-06-14 DIAGNOSIS — F4321 Adjustment disorder with depressed mood: Secondary | ICD-10-CM | POA: Diagnosis not present

## 2024-06-21 DIAGNOSIS — F4321 Adjustment disorder with depressed mood: Secondary | ICD-10-CM | POA: Diagnosis not present

## 2024-06-22 DIAGNOSIS — M79642 Pain in left hand: Secondary | ICD-10-CM | POA: Diagnosis not present

## 2024-06-22 DIAGNOSIS — M25642 Stiffness of left hand, not elsewhere classified: Secondary | ICD-10-CM | POA: Diagnosis not present

## 2024-06-28 DIAGNOSIS — F4321 Adjustment disorder with depressed mood: Secondary | ICD-10-CM | POA: Diagnosis not present

## 2024-07-03 ENCOUNTER — Ambulatory Visit: Admitting: Family Medicine

## 2024-07-18 ENCOUNTER — Ambulatory Visit: Admitting: Family Medicine

## 2024-07-18 ENCOUNTER — Encounter: Payer: Self-pay | Admitting: Family Medicine

## 2024-07-18 VITALS — BP 140/104 | HR 89 | Temp 97.9°F | Ht 70.0 in | Wt 251.1 lb

## 2024-07-18 DIAGNOSIS — F411 Generalized anxiety disorder: Secondary | ICD-10-CM | POA: Diagnosis not present

## 2024-07-18 DIAGNOSIS — Z23 Encounter for immunization: Secondary | ICD-10-CM

## 2024-07-18 DIAGNOSIS — S6982XA Other specified injuries of left wrist, hand and finger(s), initial encounter: Secondary | ICD-10-CM | POA: Diagnosis not present

## 2024-07-18 DIAGNOSIS — S6990XD Unspecified injury of unspecified wrist, hand and finger(s), subsequent encounter: Secondary | ICD-10-CM

## 2024-07-18 DIAGNOSIS — E669 Obesity, unspecified: Secondary | ICD-10-CM

## 2024-07-18 DIAGNOSIS — Z6836 Body mass index (BMI) 36.0-36.9, adult: Secondary | ICD-10-CM | POA: Diagnosis not present

## 2024-07-18 MED ORDER — WEGOVY 1 MG/0.5ML ~~LOC~~ SOAJ: 1.0000 mg | SUBCUTANEOUS | 5 refills | Status: AC

## 2024-07-18 MED ORDER — CITALOPRAM HYDROBROMIDE 40 MG PO TABS: 40.0000 mg | ORAL_TABLET | Freq: Every day | ORAL | 3 refills | Status: AC

## 2024-07-18 NOTE — Patient Instructions (Addendum)
 Let me know if you can't get wegovy  filled or if you need to change doses.   Take care.  Glad to see you. Let me know if you need referrals.   Let me know if you need a referral to general surgery later this year.

## 2024-07-18 NOTE — Progress Notes (Signed)
 He has PROM at the L 3rd and 4th DIP.  He has active extension there but not AROM flexion.  AROM flex/ext normal from PIP and MCP.    He had AROM flexion 3rd DIP yesterday.   He felt a change last night.    He has hand clinic appointment today.  He is really frustrated about his situation.    Still on citalopram , not using substance, still with med effect, rx sent.    Discussed wegovy  use.  He got med through online rx.  Taking 0.5mg  currently, with plan to inc to 1mg  in a few weeks.  No ADE on med.  Some minimal diarrhea initially with use but that resolved.  Routine cautions d/w pt, esp re: weight training, diet, pancreatitis, etc.  Weight down 13 lbs at home from max weight.   D/w pt about checking labs later this year.    Flu shot today.  Tdap up to date.    Meds, vitals, and allergies reviewed.   ROS: Per HPI unless specifically indicated in ROS section   Nad Ncat Neck supple, no LA Rrr Ctab L hand with active extension 3rd DIP but not AROM flexion.  AROM flex/ext normal from PIP and MCP.   Ventral abd wall changes noted along old scar.   Speech and judgment normal.  Accounting for the fact that he is frustrated about his hand, his affect is normal

## 2024-07-23 DIAGNOSIS — E669 Obesity, unspecified: Secondary | ICD-10-CM | POA: Insufficient documentation

## 2024-07-23 NOTE — Assessment & Plan Note (Signed)
 With hand clinic follow-up pending.  Will await that note.

## 2024-07-23 NOTE — Assessment & Plan Note (Signed)
 Continue Wegovy . Taking 0.5mg  currently, with plan to inc to 1mg  in a few weeks.  No ADE on med.  Some minimal diarrhea initially with use but that resolved.  Routine cautions d/w pt, esp re: weight training, diet, pancreatitis, etc.  Weight down 13 lbs at home from max weight.   D/w pt about checking labs later this year.  He can update me as needed.

## 2024-07-23 NOTE — Assessment & Plan Note (Signed)
 Still on citalopram , not using substance, still with med effect, rx sent.   Would continue as is.

## 2024-07-25 ENCOUNTER — Encounter (HOSPITAL_COMMUNITY): Payer: Self-pay | Admitting: Orthopedic Surgery

## 2024-07-25 ENCOUNTER — Other Ambulatory Visit: Payer: Self-pay

## 2024-07-25 ENCOUNTER — Other Ambulatory Visit: Payer: Self-pay | Admitting: Orthopedic Surgery

## 2024-07-25 NOTE — Progress Notes (Signed)
 PCP - Cleatus Arlyss RAMAN, MD  Cardiologist -   PPM/ICD - denies Device Orders - n/a Rep Notified - n/a  Chest x-ray - denies EKG - denies Stress Test - denies ECHO - denies Cardiac Cath - denies  CPAP - denies  GLP-1 -WEGOVY  Last dose 07-20-24 DM -denies  Blood Thinner Instructions: denies Aspirin Instructions: denies  ERAS Protcol - clear liquids until 6:45  COVID TEST- n/a  Anesthesia review: no  Patient verbally denies any shortness of breath, fever, cough and chest pain during phone call   -------------  SDW INSTRUCTIONS given:  Your procedure is scheduled on July 28, 2024.  Report to [facility] Main Entrance A at 7:15 A.M., and check in at the Admitting office.  Call this number if you have problems the morning of surgery:  [phone]   Remember:  Do not eat after midnight the night before your surgery  You may drink clear liquids until 6:45 the morning of your surgery.   Clear liquids allowed are: Water, Non-Citrus Juices (without pulp), Carbonated Beverages, Clear Tea, Black Coffee Only, and Gatorade    Take these medicines the morning of surgery with A SIP OF WATER  cetirizine (ZYRTEC)  citalopram  (CELEXA )   WEGOVY    As of today, STOP taking any Aspirin (unless otherwise instructed by your surgeon) Aleve, Naproxen, Ibuprofen, Motrin, Advil, Goody's, BC's, all herbal medications, fish oil, and all vitamins.                      Do not wear jewelry, make up, or nail polish            Do not wear lotions, powders, perfumes/colognes, or deodorant.            Do not shave 48 hours prior to surgery.  Men may shave face and neck.            Do not bring valuables to the hospital.            [facility] is not responsible for any belongings or valuables.  Do NOT Smoke (Tobacco/Vaping) 24 hours prior to your procedure If you use a CPAP at night, you may bring all equipment for your overnight stay.   Contacts, glasses, dentures or bridgework may not be  worn into surgery.      For patients admitted to the hospital, discharge time will be determined by your treatment team.   Patients discharged the day of surgery will not be allowed to drive home, and someone needs to stay with them for 24 hours.    Special instructions:   [place]- Preparing For Surgery  Before surgery, you can play an important role. Because skin is not sterile, your skin needs to be as free of germs as possible. You can reduce the number of germs on your skin by washing with CHG (chlorahexidine gluconate) Soap before surgery.  CHG is an antiseptic cleaner which kills germs and bonds with the skin to continue killing germs even after washing.    Oral Hygiene is also important to reduce your risk of infection.  Remember - BRUSH YOUR TEETH THE MORNING OF SURGERY WITH YOUR REGULAR TOOTHPASTE  Please do not use if you have an allergy to CHG or antibacterial soaps. If your skin becomes reddened/irritated stop using the CHG.  Do not shave (including legs and underarms) for at least 48 hours prior to first CHG shower. It is OK to shave your face.  Please follow these  instructions carefully.   Shower the NIGHT BEFORE SURGERY and the MORNING OF SURGERY with DIAL Soap.   Pat yourself dry with a CLEAN TOWEL.  Wear CLEAN PAJAMAS to bed the night before surgery  Place CLEAN SHEETS on your bed the night of your first shower and DO NOT SLEEP WITH PETS.   Day of Surgery: Please shower morning of surgery  Wear Clean/Comfortable clothing the morning of surgery Do not apply any deodorants/lotions.   Remember to brush your teeth WITH YOUR REGULAR TOOTHPASTE.   Questions were answered. Patient verbalized understanding of instructions.

## 2024-07-27 NOTE — Anesthesia Preprocedure Evaluation (Addendum)
"                                    Anesthesia Evaluation  Patient identified by MRN, date of birth, ID band Patient awake    Reviewed: Allergy & Precautions, NPO status , Patient's Chart, lab work & pertinent test results  History of Anesthesia Complications (+) history of anesthetic complications (post-op headaches)  Airway Mallampati: II  TM Distance: >3 FB Neck ROM: Full    Dental  (+) Teeth Intact, Dental Advisory Given   Pulmonary former smoker   Pulmonary exam normal breath sounds clear to auscultation       Cardiovascular Exercise Tolerance: Good negative cardio ROS Normal cardiovascular exam Rhythm:Regular Rate:Normal     Neuro/Psych  Headaches PSYCHIATRIC DISORDERS Anxiety Depression       GI/Hepatic negative GI ROS, Neg liver ROS,,,  Endo/Other  Obesity   Renal/GU negative Renal ROS     Musculoskeletal Laceration of flexor muscle, fascia and tendon of left ring finger at wrist and hand level Laceration of flexor muscle, fascia and tendon of left middle finger at wrist and hand level     Abdominal   Peds  (+) ATTENTION DEFICIT DISORDER WITHOUT HYPERACTIVITY Hematology negative hematology ROS (+)   Anesthesia Other Findings   Reproductive/Obstetrics                              Anesthesia Physical Anesthesia Plan  ASA: 2  Anesthesia Plan: MAC   Post-op Pain Management: Regional block*, Tylenol  PO (pre-op)* and Toradol  IV (intra-op)*   Induction: Intravenous  PONV Risk Score and Plan: 1 and Midazolam , TIVA, Dexamethasone  and Ondansetron   Airway Management Planned: Natural Airway and Simple Face Mask  Additional Equipment:   Intra-op Plan:   Post-operative Plan:   Informed Consent: I have reviewed the patients History and Physical, chart, labs and discussed the procedure including the risks, benefits and alternatives for the proposed anesthesia with the patient or authorized representative who has  indicated his/her understanding and acceptance.     Dental advisory given  Plan Discussed with: CRNA  Anesthesia Plan Comments:          Anesthesia Quick Evaluation  "

## 2024-07-28 ENCOUNTER — Ambulatory Visit (HOSPITAL_BASED_OUTPATIENT_CLINIC_OR_DEPARTMENT_OTHER): Payer: Self-pay | Admitting: Anesthesiology

## 2024-07-28 ENCOUNTER — Encounter (HOSPITAL_COMMUNITY): Payer: Self-pay | Admitting: Orthopedic Surgery

## 2024-07-28 ENCOUNTER — Other Ambulatory Visit: Payer: Self-pay

## 2024-07-28 ENCOUNTER — Encounter (HOSPITAL_COMMUNITY): Admission: RE | Disposition: A | Payer: Self-pay | Source: Home / Self Care | Attending: Orthopedic Surgery

## 2024-07-28 ENCOUNTER — Ambulatory Visit (HOSPITAL_COMMUNITY)

## 2024-07-28 ENCOUNTER — Encounter (HOSPITAL_COMMUNITY): Payer: Self-pay | Admitting: Anesthesiology

## 2024-07-28 ENCOUNTER — Ambulatory Visit (HOSPITAL_COMMUNITY)
Admission: RE | Admit: 2024-07-28 | Discharge: 2024-07-28 | Disposition: A | Discharge status: Home / Self Care | Attending: Orthopedic Surgery | Admitting: Orthopedic Surgery

## 2024-07-28 DIAGNOSIS — Z6835 Body mass index (BMI) 35.0-35.9, adult: Secondary | ICD-10-CM | POA: Diagnosis not present

## 2024-07-28 DIAGNOSIS — T84410A Breakdown (mechanical) of muscle and tendon graft, initial encounter: Secondary | ICD-10-CM | POA: Diagnosis present

## 2024-07-28 DIAGNOSIS — Z87891 Personal history of nicotine dependence: Secondary | ICD-10-CM | POA: Diagnosis not present

## 2024-07-28 DIAGNOSIS — Y832 Surgical operation with anastomosis, bypass or graft as the cause of abnormal reaction of the patient, or of later complication, without mention of misadventure at the time of the procedure: Secondary | ICD-10-CM | POA: Insufficient documentation

## 2024-07-28 DIAGNOSIS — T84498A Other mechanical complication of other internal orthopedic devices, implants and grafts, initial encounter: Secondary | ICD-10-CM | POA: Diagnosis not present

## 2024-07-28 DIAGNOSIS — E669 Obesity, unspecified: Secondary | ICD-10-CM | POA: Diagnosis not present

## 2024-07-28 HISTORY — DX: Other complications of anesthesia, initial encounter: T88.59XA

## 2024-07-28 HISTORY — DX: Headache, unspecified: R51.9

## 2024-07-28 LAB — CBC
HCT: 43.7 % (ref 39.0–52.0)
Hemoglobin: 15.5 g/dL (ref 13.0–17.0)
MCH: 29.5 pg (ref 26.0–34.0)
MCHC: 35.5 g/dL (ref 30.0–36.0)
MCV: 83.2 fL (ref 80.0–100.0)
Platelets: 282 K/uL (ref 150–400)
RBC: 5.25 MIL/uL (ref 4.22–5.81)
RDW: 12 % (ref 11.5–15.5)
WBC: 5.9 K/uL (ref 4.0–10.5)
nRBC: 0 % (ref 0.0–0.2)

## 2024-07-28 MED ORDER — DEXAMETHASONE SOD PHOSPHATE PF 10 MG/ML IJ SOLN
INTRAMUSCULAR | Status: DC | PRN
  Administered 2024-07-28: 10 mg via INTRAVENOUS

## 2024-07-28 MED ORDER — FENTANYL CITRATE (PF) 100 MCG/2ML IJ SOLN
INTRAMUSCULAR | Status: AC
  Filled 2024-07-28: qty 2

## 2024-07-28 MED ORDER — BUPIVACAINE HCL (PF) 0.25 % IJ SOLN
INTRAMUSCULAR | Status: DC | PRN
  Administered 2024-07-28: 14 mL

## 2024-07-28 MED ORDER — DEXMEDETOMIDINE HCL IN NACL 80 MCG/20ML IV SOLN
INTRAVENOUS | Status: AC
  Filled 2024-07-28: qty 20

## 2024-07-28 MED ORDER — ONDANSETRON HCL 4 MG/2ML IJ SOLN
INTRAMUSCULAR | Status: DC | PRN
  Administered 2024-07-28: 4 mg via INTRAVENOUS

## 2024-07-28 MED ORDER — FENTANYL CITRATE (PF) 100 MCG/2ML IJ SOLN: 50.0000 ug | Freq: Once | INTRAMUSCULAR | Status: AC

## 2024-07-28 MED ORDER — ORAL CARE MOUTH RINSE: 15.0000 mL | Freq: Once | OROMUCOSAL | Status: AC

## 2024-07-28 MED ORDER — BUPIVACAINE HCL (PF) 0.25 % IJ SOLN
INTRAMUSCULAR | Status: AC
  Filled 2024-07-28: qty 30

## 2024-07-28 MED ORDER — FENTANYL CITRATE (PF) 100 MCG/2ML IJ SOLN
INTRAMUSCULAR | Status: AC
  Administered 2024-07-28: 50 ug via INTRAVENOUS
  Filled 2024-07-28: qty 2

## 2024-07-28 MED ORDER — MIDAZOLAM HCL (PF) 2 MG/2ML IJ SOLN: 2.0000 mg | Freq: Once | INTRAMUSCULAR | Status: AC

## 2024-07-28 MED ORDER — MIDAZOLAM HCL 2 MG/2ML IJ SOLN
INTRAMUSCULAR | Status: AC
  Administered 2024-07-28: 2 mg via INTRAVENOUS
  Filled 2024-07-28: qty 2

## 2024-07-28 MED ORDER — KETOROLAC TROMETHAMINE 30 MG/ML IJ SOLN
INTRAMUSCULAR | Status: DC | PRN
  Administered 2024-07-28: 30 mg via INTRAVENOUS

## 2024-07-28 MED ORDER — FENTANYL CITRATE (PF) 100 MCG/2ML IJ SOLN
25.0000 ug | INTRAMUSCULAR | Status: DC | PRN
  Administered 2024-07-28 (×3): 50 ug via INTRAVENOUS

## 2024-07-28 MED ORDER — LACTATED RINGERS IV SOLN: INTRAVENOUS | Status: DC

## 2024-07-28 MED ORDER — MIDAZOLAM HCL 2 MG/2ML IJ SOLN
INTRAMUSCULAR | Status: AC
  Filled 2024-07-28: qty 2

## 2024-07-28 MED ORDER — ACETAMINOPHEN 500 MG PO TABS
1000.0000 mg | ORAL_TABLET | Freq: Once | ORAL | Status: AC
  Administered 2024-07-28: 1000 mg via ORAL
  Filled 2024-07-28: qty 2

## 2024-07-28 MED ORDER — PROPOFOL 500 MG/50ML IV EMUL
INTRAVENOUS | Status: DC | PRN
  Administered 2024-07-28: 90 ug/kg/min via INTRAVENOUS
  Administered 2024-07-28: 135 ug/kg/min via INTRAVENOUS

## 2024-07-28 MED ORDER — DEXMEDETOMIDINE HCL IN NACL 80 MCG/20ML IV SOLN
INTRAVENOUS | Status: DC | PRN
  Administered 2024-07-28 (×2): 12 ug via INTRAVENOUS

## 2024-07-28 MED ORDER — BUPIVACAINE-EPINEPHRINE (PF) 0.5% -1:200000 IJ SOLN
INTRAMUSCULAR | Status: DC | PRN
  Administered 2024-07-28: 30 mL via PERINEURAL

## 2024-07-28 MED ORDER — PROPOFOL 10 MG/ML IV BOLUS
INTRAVENOUS | Status: DC | PRN
  Administered 2024-07-28: 200 mg via INTRAVENOUS

## 2024-07-28 MED ORDER — MIDAZOLAM HCL (PF) 2 MG/2ML IJ SOLN
INTRAMUSCULAR | Status: DC | PRN
  Administered 2024-07-28: 2 mg via INTRAVENOUS

## 2024-07-28 MED ORDER — CHLORHEXIDINE GLUCONATE 0.12 % MT SOLN
15.0000 mL | Freq: Once | OROMUCOSAL | Status: AC
  Administered 2024-07-28: 15 mL via OROMUCOSAL
  Filled 2024-07-28: qty 15

## 2024-07-28 MED ORDER — AMISULPRIDE (ANTIEMETIC) 5 MG/2ML IV SOLN: 10.0000 mg | Freq: Once | INTRAVENOUS | Status: DC | PRN

## 2024-07-28 MED ORDER — 0.9 % SODIUM CHLORIDE (POUR BTL) OPTIME
TOPICAL | Status: DC | PRN
  Administered 2024-07-28: 2000 mL

## 2024-07-28 MED ORDER — FENTANYL CITRATE (PF) 250 MCG/5ML IJ SOLN
INTRAMUSCULAR | Status: DC | PRN
  Administered 2024-07-28 (×2): 25 ug via INTRAVENOUS
  Administered 2024-07-28: 50 ug via INTRAVENOUS

## 2024-07-28 MED ORDER — ONDANSETRON HCL 4 MG/2ML IJ SOLN: 4.0000 mg | Freq: Once | INTRAMUSCULAR | Status: DC | PRN

## 2024-07-28 MED ORDER — LIDOCAINE HCL 1 % IJ SOLN
INTRAMUSCULAR | Status: DC | PRN
  Administered 2024-07-28: 14 mL

## 2024-07-28 MED ORDER — CLONIDINE HCL (ANALGESIA) 100 MCG/ML EP SOLN
EPIDURAL | Status: DC | PRN
  Administered 2024-07-28: 50 ug

## 2024-07-28 MED ORDER — CEFAZOLIN SODIUM-DEXTROSE 2-3 GM-%(50ML) IV SOLR
INTRAVENOUS | Status: DC | PRN
  Administered 2024-07-28: 2 g via INTRAVENOUS

## 2024-07-28 MED ORDER — LIDOCAINE 2% (20 MG/ML) 5 ML SYRINGE
INTRAMUSCULAR | Status: DC | PRN
  Administered 2024-07-28: 100 mg via INTRAVENOUS

## 2024-07-28 MED ORDER — PROPOFOL 10 MG/ML IV BOLUS
INTRAVENOUS | Status: AC
  Filled 2024-07-28: qty 20

## 2024-07-28 MED ORDER — LIDOCAINE HCL (PF) 1 % IJ SOLN
INTRAMUSCULAR | Status: AC
  Filled 2024-07-28: qty 30

## 2024-07-28 NOTE — Op Note (Signed)
 I assisted Surgeons and Role:    * Chiaramonti, Marsa HERO, MD - Primary    * Murrell Kuba, MD - Assisting on the Procedures: REPAIR, TENDON, FLEXOR DISTAL INTERPHALANGEAL JOINT FUSION on 07/28/2024.  I provided assistance on this case as follows: Set up, approach, identification of the extensor tendon toe, harvesting of the tendon graft, the wound, creation of the dressing Approach to the hand, identification nerve or vascular bundles for protection, fixation of the ruptured tendon graft, removal of the graft, preparation of the distal phalanx for anchors, creation of the sheath, placement of toe tendon graft, closure of the wound and application of the dressing and splint.  Electronically signed by: Kuba Murrell, MD Date: 07/28/2024 Time: 1:50 PM

## 2024-07-28 NOTE — Transfer of Care (Signed)
 Immediate Anesthesia Transfer of Care Note  Patient: Jorge Francis  Procedure(s) Performed: REPAIR, TENDON, FLEXOR (Left) DISTAL INTERPHALANGEAL JOINT FUSION (Left)  Patient Location: PACU  Anesthesia Type:General  Level of Consciousness: drowsy and patient cooperative  Airway & Oxygen Therapy: Patient Spontanous Breathing and Patient connected to face mask oxygen  Post-op Assessment: Report given to RN and Post -op Vital signs reviewed and stable  Post vital signs: Reviewed and stable  Last Vitals:  Vitals Value Taken Time  BP 146/98 07/28/24 13:54  Temp    Pulse 98 07/28/24 13:55  Resp 21 07/28/24 13:55  SpO2 96 % 07/28/24 13:55  Vitals shown include unfiled device data.  Last Pain:  Vitals:   07/28/24 0840  TempSrc:   PainSc: 0-No pain         Complications: No notable events documented.
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
## 2024-07-28 NOTE — Op Note (Signed)
 NAME: Jorge Francis MEDICAL RECORD NO: [id number] DATE OF BIRTH: 1981-05-07 FACILITY: [facility] LOCATION: [facility] OR PHYSICIAN: MARSA EMERSON CHRISTEN MD   OPERATIVE REPORT   DATE OF PROCEDURE: 07/28/24    PREOPERATIVE DIAGNOSIS:  Failed flexor tendon reconstruction left long finger   POSTOPERATIVE DIAGNOSIS:  same   PROCEDURE:  Left long finger FDP reconstruction with left 2nd toe extensor tendon autograft (CPT [phone])   SURGEON: Marsa HERO. Adisa Litt, M.D.   ASSISTANT: Arley Curia, MD   ANESTHESIA:  General with regional   INTRAVENOUS FLUIDS:  Per anesthesia flow sheet.   ESTIMATED BLOOD LOSS:  Minimal.   COMPLICATIONS:  None.   SPECIMENS:  left long finger flexor tendon sheath cultures x2   TOURNIQUET TIME:    Total Tourniquet Time Documented: Thigh (Left) - 35 minutes Total: Thigh (Left) - 35 minutes  Upper Arm (Left) - 121 minutes Total: Upper Arm (Left) - 121 minutes    DISPOSITION:  Stable to PACU.   INDICATIONS:  44 year old male who sustained a prior zone 2 flexor tendon laceration involving only the FDP tendon of the left long finger.  This repair failed and he underwent two stage reconstruction with palmaris longus autograft.  The palmaris longus autograft failed not long after the suture over a button was removed.  We had a lengthy discussion about treatment options, including doing nothing, fusing the DIP joint, and revision with 2nd toe extensor tendon autograft.  He elected to undergo revision reconstruction with toe extensor tendon autograft.  OPERATIVE COURSE:   Patient was identified in holding.  The sites, side, and surgery were confirmed with the patient. The patient was brought back to the operating room and transferred to the OR table.  He was positioned supine with the operative extremity on outstretched on a radiolucent hand table.  A well-padded tourniquet was placed on the left upper arm of the operative extremity as well as the left thigh.     First, attention was turned to obtaining the autograft from the left 2nd toe.  The left lower extremity was prepped and draped in typical sterile fashion. A timeout was performed and all were in agreement.  The operative extremity was exsanguinated with Esmarch bandage and tourniquet inflated to 300 mmHg.  An incision was made over the MTP joint of the second toe.  The soft tissues were dissected down to the extensor tendon.  A suture was placed through the tendon to tag it.  Then the tendon was incised transversely just distal to the tagging stitch.  The stitch was held and a tendon stripper was introduced in a retrograde fashion.  Once the tendon stripper had resistance a small transverse incision was made at this level to free up any adhesions.  Then the tendon stripper was passed again up to the level of the retinaculum.  A transverse incision was made at the level of the retinaculum and this was freed.  The tendon stripper was then passed proximally until the tendon was completely freed and able to be moved to the back table.  The tendon was placed in a wet Ray-Tec to keep from drying out on the back table.  Local anesthetic was given to the incision sites along the dorsal foot and ankle consisting of a mix of 1% lidocaine  plain and 0.25% bupivacaine  plain . The wounds were irrigated thoroughly.  Hemostasis was achieved with electrocautery.  Then the wounds were closed with 4-0 nylon suture.  The incision sites were covered with Xeroform gauze  and then the foot and leg were wrapped with Webril and then a loosely wrapped Ace bandage.  The tourniquet was deflated.  Next attention was turned to the left long finger. The left upper extremity was prepped and draped in typical sterile fashion. A timeout was performed and all were in agreement. The operative extremity was exsanguinated with Esmarch bandage and tourniquet inflated to 250 mmHg.  A Bruner incision was made on the long finger finger.   Full-thickness skin flaps were raised with care taken to protect the subcutaneous neurovascular structures.  The subcutaneous tissue was bluntly dissected down to the tendon sheath.  The wound was explored and the tendon sheath was still intact.  The prior FDP tendon autograft was found in the palm of the hand and found to be ruptured distally but intact at the Pulvertaft weave proximally.  The sutures for the Pulvertaft weave were carefully removed and the prior graft was removed as well.  Next Mytec micro anchors were placed in the distal phalanx of the long finger and verified under fluoroscopy.  A infant feeding tube was used to pass retrograde through the tendon sheath.  Next a tendon grasper was used also passed in retrograde fashion in the new tendon autograft was pulled through distally.  The sutures for the anchor were used to secure the new autograft in place.   The 4-0 Orthocord suture tails were used to then reinforced the autograft passing through the A5 pulley and surrounding tissue and also through the  DIP joint capsule.  The wound was thoroughly irrigated and the skin closed with 5-0 nylon sutures to the level of the palm.  Next, the long finger tension was set by flexing down by pulling traction on the graft to match the cascade of the index and small finger.  At this level, a tendon Lelon was used to create a Pulvertaft weave of the autograft to the proximal stump of the FDP tendon proximal to the prior weave scar tissue with 3-0 fiberwire.  The scar tissue was carefully excised.   Next, the wound was again irrigated with normal saline.  The tourniquet was deflated.   Hemostasis was achieved with bipolar electrocautery.  The skin in the palm was closed with 5-0 nylons.  Xeroform was placed over the incision.  This was followed by gauze and sterile Webril.  Patient was then placed in a dorsal blocking splint.  Counts were correct x2.  They were awakened and taken to PACU without  issue.  POST-OP PLAN: They will be discharged on pain medication and antibiotics.  Follow up in clinic in 5-7 days.  The patient will remain nonweightbearing in the splint until then.   At follow-up appointment they will be seen by occupational therapy and will have a brace made and will start a early passive motion protocol.      MARSA CHRISTELLA CHRISTEN, MD Electronically signed, 07/28/24

## 2024-07-28 NOTE — Progress Notes (Signed)
 Orthopedic Tech Progress Note Patient Details:  Jorge Francis [birth year] [id number]  Ortho Devices Type of Ortho Device: Postop shoe/boot Ortho Device/Splint Location: Delivered to OR room 9 Ortho Device/Splint Interventions: Ordered   Post Interventions Patient Tolerated: Well  [name] 07/28/2024, 11:18 AM

## 2024-07-28 NOTE — Anesthesia Procedure Notes (Addendum)
 Anesthesia Regional Block: Axillary brachial plexus block   Pre-Anesthetic Checklist: , timeout performed,  Correct Patient, Correct Site, Correct Laterality,  Correct Procedure, Correct Position, site marked,  Risks and benefits discussed,  Surgical consent,  Pre-op evaluation,  At surgeon's request and post-op pain management  Laterality: Left  Prep: chloraprep       Needles:  Injection technique: Single-shot  Needle Type: Echogenic Needle     Needle Length: 9cm  Needle Gauge: 21     Additional Needles:   Procedures:,,,, ultrasound used (permanent image in chart),,    Narrative:  Start time: 07/28/2024 8:40 AM End time: 07/28/2024 8:46 AM Injection made incrementally with aspirations every 5 mL.  Performed by: Personally  Anesthesiologist: Corinne Garnette BRAVO, MD  Additional Notes: No pain on injection. No increased resistance to injection. Injection made in 5cc increments.  Good needle visualization.  Patient tolerated procedure well.

## 2024-07-28 NOTE — Discharge Instructions (Signed)
 [facility] Post-Operative Instructions    General -These instructions are to compliment information given to you by your surgeon. -You may resume your normal diet as tolerated.  -You may resume your normal medications unless specifically instructed to stop taking a certain medication. -If you are not sure about restarting one of your medications after surgery, please contact the office during normal business hours and we will be able to assist you.   Post-Operative Dressings Splint: If you have a splint on your operative arm, it should stay on with all the dressings over it until you return for follow-up. It is ok to take a shower while you are wearing your splint, as long as you do not get it wet. The splint must be covered and kept clean and dry. If your splint gets wet, please call the clinic as you may need to come to clinic early for this to be changed.   If you have questions about your dressings, please call the clinic.   Post-operative Wound Care In general:  Any sutures or anything that will not fall off over time on their own, will be removed in clinic by our staff.  -If your surgical wound/incision was closed with non-absorbable sutures or staples, they will be removed at your first post-op visit.   -If your incision was closed with absorbable sutures, they do not need to be removed as they will dissolve on their own. They may be visible or buried underneath the skin.    You may have one of the following over your incision/wound.  They can all get wet and should remain in place until they fall off on their own. -Small stickers called steri-strips.  You do not need to remove them. If the ends start to peel up, you can carefully trim them with scissors. -Skin glue often known by the brand name Dermabond.  Do not remove, it will fall off over time.  Certain procedures such as fracture fixation may require the use of pins that  stick out through the skin.  If you have visible pins, please be careful to not bump them or move them.    Regardless of any sutures, stickers or glue used to close your wound, do not submerge the wound in any standing water for at least 4 weeks after surgery.  It is okay to let the shower water run over the wound and gently clean the wound with soapy water then rinse and gently pat dry.   Weightbearing/Activity  -Do not use your operative extremity for any lifting or weight bearing  -If you received a nerve block, keep the sling on until the block completely wears off  Pain Control - After your surgery, post-surgical discomfort or pain is normal. This discomfort can last several days to a few weeks. At certain times of the day (usually evenings/nights) your discomfort may be more intense.   - Do not drive while taking narcotic pain medications.   General Anesthesia or Bier Block: If you did not receive a nerve block during your surgery, you will need to start taking your pain medication shortly after your surgery and should continue to do so as prescribed by your surgeon.  Nerve Block:  If you received a nerve block, it may provide pain relief for one hour to up to two days after your surgery. As long as the nerve block is working, you will experience little or no sensation in the area the  surgeon operated on.  As the nerve block wears off, you will begin to experience pain or discomfort. It is very important that you begin taking your prescribed pain medication before the nerve block fully wears off.  Treating your pain at the first sign of the block wearing off will ensure your pain is better controlled and more tolerable when full-sensation returns. Do not wait until the pain is intolerable, as the medicine will be less effective. It is better to treat pain in advance than to try and catch up.  If the nerve block made your entire arm numb, you will be given an arm sling that you should wear  until the block wears off, but no longer than 2 days unless otherwise instructed.   Pain Medication:  Typically, post operative pain can be managed by alternating tylenol  and an anti inflammatory medication.   We may also prescribe an opioid pain medication such as Oxycodone , Percocet (oxycodone  with Tylenol ) or Norco (hydrocodone with Tylenol ) for post-operative pain. Some of these medications contain Tylenol  (acetaminophen ) in them.  It takes between 30 and 45 minutes before pain medication starts to work. It is important to take your medication before your pain level gets too intense.   Nausea is a common side effect of many pain medications. You will want to eat something before taking your pain medicine to help prevent nausea.   If you are taking a prescription opioid pain medication that contains acetaminophen  (Tylenol ), we recommend that you do not take additional over the counter acetaminophen  (Tylenol ).   If you are prescribed oxycodone  WITHOUT acetaminophen  (Tylenol ) in it and you do not have a known allergy, we recommend you take over-the-counter acetaminophen  (Tylenol ) with the oxycodone .  Take over-the-counter stool softener such as Colace or Sennakot while taking narcotic pain medications to help prevent constipation.    Other pain relieving options:  Elevation: Elevating your operative extremity can be very helpful to reduce this pain. Prop your arm up on pillows to keep the operative site above the level of your heart. If you develop tingling from prolonged elevation, take a break from elevating and this should resolve.  Icing: If you do not have a splint/cast, using a cold pack to ice the affected area a few times a day (15 to 20 minutes at a time) can also help to relieve pain, reduce swelling and bruising.   If you can take nonsteroidal anti-inflammatory medications (NSAIDs, for example: Ibuprofen, Advil, Motrin, Aleve, etc.), you may take them to help control your  pain. If you are unsure whether you can take anti-inflammatory medications, please check with your primary care provider.  If you are already taking a prescription of anti-inflammatory medications such as Celebrex  (celecoxib ) or Mobic  (meloxicam ) you should not take any additional anti-inflammatory drugs such as Ibuprofen, Advil, Motrin, or Aleve.    Follow Up Please call [facility] at [phone] if you do not receive or are unsure of your first follow-up appointment.  You should see your surgeon or PA 10-16 days after your surgery unless otherwise instructed.    Please call the office for any problems, including the following:  - Excessive redness of the incisions - Drainage for more than 4 days - Fever of more than 101.5 F - Nausea/vomiting that does not stop  - Numbness, tingling, or discoloration of extremity  - Unable to drink fluids  - Uncontrollable pain     Marsa Christen, MD Hand and [facility] Ext[facility]ity [facility] [phone]

## 2024-07-28 NOTE — Anesthesia Postprocedure Evaluation (Signed)
"   Anesthesia Post Note  Patient: Jorge Francis  Procedure(s) Performed: REPAIR, TENDON, FLEXOR (Left) DISTAL INTERPHALANGEAL JOINT FUSION (Left)     Patient location during evaluation: PACU Anesthesia Type: General Level of consciousness: awake and alert Pain management: pain level controlled Vital Signs Assessment: post-procedure vital signs reviewed and stable Respiratory status: spontaneous breathing, nonlabored ventilation, respiratory function stable and patient connected to nasal cannula oxygen Cardiovascular status: blood pressure returned to baseline and stable Postop Assessment: no apparent nausea or vomiting Anesthetic complications: no   No notable events documented.  Last Vitals:  Vitals:   07/28/24 1415 07/28/24 1430  BP: (!) 142/97 119/82  Pulse: 91 91  Resp: 16 17  Temp:    SpO2: 90% 94%    Last Pain:  Vitals:   07/28/24 1430  TempSrc:   PainSc: 6     LLE Motor Response: Purposeful movement (07/28/24 1430) LLE Sensation: Full sensation (07/28/24 1430)          Garnette FORBES Skillern      "

## 2024-07-28 NOTE — Anesthesia Procedure Notes (Signed)
 Procedure Name: LMA Insertion Date/Time: 07/28/2024 10:04 AM  Performed by: Boyce Shilling, CRNAPre-anesthesia Checklist: Patient identified, Emergency Drugs available, Suction available, Timeout performed and Patient being monitored Patient Re-evaluated:Patient Re-evaluated prior to induction Oxygen Delivery Method: Circle system utilized Preoxygenation: Pre-oxygenation with 100% oxygen Induction Type: IV induction Ventilation: Mask ventilation without difficulty LMA: LMA inserted LMA Size: 4.0 Number of attempts: 1 Placement Confirmation: ETT inserted through vocal cords under direct vision, positive ETCO2, CO2 detector and breath sounds checked- equal and bilateral Tube secured with: Tape Dental Injury: Teeth and Oropharynx as per pre-operative assessment

## 2024-07-30 ENCOUNTER — Ambulatory Visit: Payer: Self-pay | Admitting: Family Medicine

## 2024-08-02 LAB — AEROBIC/ANAEROBIC CULTURE W GRAM STAIN (SURGICAL/DEEP WOUND)
Culture: NO GROWTH
Gram Stain: NONE SEEN

## 2024-08-11 ENCOUNTER — Encounter (HOSPITAL_COMMUNITY): Payer: Self-pay | Admitting: Orthopedic Surgery

## 2024-08-15 ENCOUNTER — Emergency Department (HOSPITAL_BASED_OUTPATIENT_CLINIC_OR_DEPARTMENT_OTHER)
Admission: EM | Admit: 2024-08-15 | Discharge: 2024-08-15 | Disposition: A | Discharge status: Home / Self Care | Source: Ambulatory Visit | Attending: Emergency Medicine | Admitting: Emergency Medicine

## 2024-08-15 ENCOUNTER — Other Ambulatory Visit: Payer: Self-pay

## 2024-08-15 ENCOUNTER — Ambulatory Visit: Payer: Self-pay

## 2024-08-15 ENCOUNTER — Encounter (HOSPITAL_BASED_OUTPATIENT_CLINIC_OR_DEPARTMENT_OTHER): Payer: Self-pay | Admitting: Emergency Medicine

## 2024-08-15 ENCOUNTER — Emergency Department (HOSPITAL_BASED_OUTPATIENT_CLINIC_OR_DEPARTMENT_OTHER)

## 2024-08-15 DIAGNOSIS — K219 Gastro-esophageal reflux disease without esophagitis: Secondary | ICD-10-CM | POA: Insufficient documentation

## 2024-08-15 DIAGNOSIS — R1013 Epigastric pain: Secondary | ICD-10-CM

## 2024-08-15 LAB — CBC
HCT: 50.6 % (ref 39.0–52.0)
Hemoglobin: 17.3 g/dL — ABNORMAL HIGH (ref 13.0–17.0)
MCH: 29.2 pg (ref 26.0–34.0)
MCHC: 34.2 g/dL (ref 30.0–36.0)
MCV: 85.3 fL (ref 80.0–100.0)
Platelets: 272 10*3/uL (ref 150–400)
RBC: 5.93 MIL/uL — ABNORMAL HIGH (ref 4.22–5.81)
RDW: 12.5 % (ref 11.5–15.5)
WBC: 7.5 10*3/uL (ref 4.0–10.5)
nRBC: 0 % (ref 0.0–0.2)

## 2024-08-15 LAB — URINALYSIS, ROUTINE W REFLEX MICROSCOPIC
Bilirubin Urine: NEGATIVE
Glucose, UA: NEGATIVE mg/dL
Ketones, ur: NEGATIVE mg/dL
Leukocytes,Ua: NEGATIVE
Nitrite: NEGATIVE
Protein, ur: NEGATIVE mg/dL
Specific Gravity, Urine: 1.006 (ref 1.005–1.030)
pH: 6 (ref 5.0–8.0)

## 2024-08-15 LAB — COMPREHENSIVE METABOLIC PANEL WITH GFR
ALT: 18 U/L (ref 0–44)
AST: 19 U/L (ref 15–41)
Albumin: 4.5 g/dL (ref 3.5–5.0)
Alkaline Phosphatase: 126 U/L (ref 38–126)
Anion gap: 13 (ref 5–15)
BUN: 10 mg/dL (ref 6–20)
CO2: 25 mmol/L (ref 22–32)
Calcium: 9.9 mg/dL (ref 8.9–10.3)
Chloride: 98 mmol/L (ref 98–111)
Creatinine, Ser: 1.05 mg/dL (ref 0.61–1.24)
GFR, Estimated: >60 mL/min
Glucose, Bld: 93 mg/dL (ref 70–99)
Potassium: 3.7 mmol/L (ref 3.5–5.1)
Sodium: 136 mmol/L (ref 135–145)
Total Bilirubin: 0.5 mg/dL (ref 0.0–1.2)
Total Protein: 7.9 g/dL (ref 6.5–8.1)

## 2024-08-15 LAB — LIPASE, BLOOD: Lipase: 28 U/L (ref 11–51)

## 2024-08-15 MED ORDER — PANTOPRAZOLE SODIUM 20 MG PO TBEC: 20.0000 mg | DELAYED_RELEASE_TABLET | Freq: Two times a day (BID) | ORAL | 0 refills | Status: AC

## 2024-08-15 MED ORDER — IOHEXOL 300 MG/ML  SOLN
100.0000 mL | Freq: Once | INTRAMUSCULAR | Status: AC | PRN
  Administered 2024-08-15: 100 mL via INTRAVENOUS

## 2024-08-15 MED ORDER — ONDANSETRON HCL 4 MG/2ML IJ SOLN
4.0000 mg | Freq: Once | INTRAMUSCULAR | Status: AC
  Administered 2024-08-15: 4 mg via INTRAVENOUS
  Filled 2024-08-15: qty 2

## 2024-08-15 MED ORDER — ONDANSETRON 4 MG PO TBDP: 4.0000 mg | ORAL_TABLET | Freq: Four times a day (QID) | ORAL | 0 refills | Status: AC | PRN

## 2024-08-15 MED ORDER — SODIUM CHLORIDE 0.9 % IV BOLUS
1000.0000 mL | Freq: Once | INTRAVENOUS | Status: AC
  Administered 2024-08-15: 1000 mL via INTRAVENOUS

## 2024-08-15 MED ORDER — PANTOPRAZOLE SODIUM 40 MG IV SOLR
40.0000 mg | Freq: Once | INTRAVENOUS | Status: AC
  Administered 2024-08-15: 40 mg via INTRAVENOUS
  Filled 2024-08-15: qty 10

## 2024-08-15 NOTE — ED Triage Notes (Signed)
 Abdo pain x 2 days Constipation Ducolax  Not water stool On wegovy 

## 2024-08-15 NOTE — Discharge Instructions (Signed)
 You can take the nausea medication up to every 6 hours as needed.  Please begin taking the new acid reflux medication I have prescribed, follow-up with your doctor for refill if this medication is effective and you would like to continue taking it. Please drink plenty of fluids including electrolyte containing fluids, such as pedialyte, gatorade to help with dehydration. It is reasonable to stick to a more gentle diet until the stomach discomfort resolves.  As we discussed it is possible that you may have had a mild gastroenteritis or gastritis that was irritating the stomach and making the acid more pronounced.

## 2024-08-16 NOTE — Telephone Encounter (Signed)
 Documentation of phone call at approximately 5:15 PM yesterday.  Called patient.  He had gone to ER.  Reassuring workup.  Discussed that he could delay next Wegovy  dose until his symptoms are completely resolved.  He can update me as needed.  I thanked him for taking the call.
